# Patient Record
Sex: Male | Born: 1965 | Race: White | Hispanic: No | Marital: Married | Smoking: Former smoker
Health system: Southern US, Community
[De-identification: ages and names within clinical notes are randomized; demographics above are authoritative.]

## PROBLEM LIST (undated history)

## (undated) DIAGNOSIS — R7303 Prediabetes: Secondary | ICD-10-CM

## (undated) DIAGNOSIS — I4821 Permanent atrial fibrillation: Secondary | ICD-10-CM

## (undated) HISTORY — DX: Permanent atrial fibrillation: I48.21

## (undated) HISTORY — DX: Prediabetes: R73.03

---

## 2014-07-22 HISTORY — PX: SEPTOPLASTY: SUR1290

## 2014-08-20 DIAGNOSIS — J324 Chronic pansinusitis: Secondary | ICD-10-CM

## 2014-08-20 DIAGNOSIS — J3489 Other specified disorders of nose and nasal sinuses: Secondary | ICD-10-CM | POA: Insufficient documentation

## 2014-08-20 DIAGNOSIS — D140 Benign neoplasm of middle ear, nasal cavity and accessory sinuses: Secondary | ICD-10-CM

## 2014-08-20 HISTORY — DX: Other specified disorders of nose and nasal sinuses: J34.89

## 2014-08-20 HISTORY — DX: Benign neoplasm of middle ear, nasal cavity and accessory sinuses: D14.0

## 2014-08-20 HISTORY — DX: Chronic pansinusitis: J32.4

## 2014-12-27 ENCOUNTER — Inpatient Hospital Stay (HOSPITAL_COMMUNITY)
Admission: EM | Admit: 2014-12-27 | Discharge: 2014-12-30 | DRG: 175 | Disposition: A | Discharge status: Home / Self Care | Payer: BLUE CROSS/BLUE SHIELD | Source: Other Acute Inpatient Hospital | Attending: Internal Medicine | Admitting: Internal Medicine

## 2014-12-27 ENCOUNTER — Encounter (HOSPITAL_COMMUNITY): Payer: Self-pay | Admitting: *Deleted

## 2014-12-27 DIAGNOSIS — I10 Essential (primary) hypertension: Secondary | ICD-10-CM | POA: Diagnosis present

## 2014-12-27 DIAGNOSIS — Z833 Family history of diabetes mellitus: Secondary | ICD-10-CM

## 2014-12-27 DIAGNOSIS — I82493 Acute embolism and thrombosis of other specified deep vein of lower extremity, bilateral: Secondary | ICD-10-CM | POA: Diagnosis present

## 2014-12-27 DIAGNOSIS — J9601 Acute respiratory failure with hypoxia: Secondary | ICD-10-CM

## 2014-12-27 DIAGNOSIS — I2699 Other pulmonary embolism without acute cor pulmonale: Secondary | ICD-10-CM | POA: Diagnosis present

## 2014-12-27 DIAGNOSIS — Z6841 Body Mass Index (BMI) 40.0 and over, adult: Secondary | ICD-10-CM

## 2014-12-27 DIAGNOSIS — D696 Thrombocytopenia, unspecified: Secondary | ICD-10-CM | POA: Diagnosis present

## 2014-12-27 DIAGNOSIS — I482 Chronic atrial fibrillation: Secondary | ICD-10-CM

## 2014-12-27 DIAGNOSIS — Z8249 Family history of ischemic heart disease and other diseases of the circulatory system: Secondary | ICD-10-CM | POA: Diagnosis not present

## 2014-12-27 DIAGNOSIS — I4891 Unspecified atrial fibrillation: Secondary | ICD-10-CM | POA: Diagnosis present

## 2014-12-27 HISTORY — DX: Essential (primary) hypertension: I10

## 2014-12-27 HISTORY — DX: Other pulmonary embolism without acute cor pulmonale: I26.99

## 2014-12-27 HISTORY — DX: Acute respiratory failure with hypoxia: J96.01

## 2014-12-27 HISTORY — DX: Morbid (severe) obesity due to excess calories: E66.01

## 2014-12-27 LAB — MRSA PCR SCREENING: MRSA by PCR: POSITIVE — AB

## 2014-12-27 MED ORDER — LEVALBUTEROL HCL 0.63 MG/3ML IN NEBU: 0.6300 mg | INHALATION_SOLUTION | Freq: Four times a day (QID) | RESPIRATORY_TRACT | Status: DC | PRN

## 2014-12-27 MED ORDER — SODIUM CHLORIDE 0.9 % IJ SOLN
3.0000 mL | INTRAMUSCULAR | Status: DC | PRN
Start: 2014-12-27 — End: 2014-12-28

## 2014-12-27 MED ORDER — ONDANSETRON HCL 4 MG/2ML IJ SOLN: 4.0000 mg | Freq: Four times a day (QID) | INTRAMUSCULAR | Status: DC | PRN

## 2014-12-27 MED ORDER — METOPROLOL TARTRATE 25 MG PO TABS
25.0000 mg | ORAL_TABLET | Freq: Two times a day (BID) | ORAL | Status: DC
Start: 2014-12-27 — End: 2014-12-28
  Administered 2014-12-27: 25 mg via ORAL
  Filled 2014-12-27 (×3): qty 1

## 2014-12-27 MED ORDER — OXYCODONE HCL 5 MG PO TABS
5.0000 mg | ORAL_TABLET | ORAL | Status: DC | PRN
  Administered 2014-12-29 – 2014-12-30 (×4): 5 mg via ORAL
  Filled 2014-12-27 (×4): qty 1

## 2014-12-27 MED ORDER — MUPIROCIN 2 % EX OINT
1.0000 "application " | TOPICAL_OINTMENT | Freq: Two times a day (BID) | CUTANEOUS | Status: DC
  Administered 2014-12-27 – 2014-12-30 (×6): 1 via NASAL
  Filled 2014-12-27 (×2): qty 22

## 2014-12-27 MED ORDER — DILTIAZEM HCL 100 MG IV SOLR
5.0000 mg/h | INTRAVENOUS | Status: DC
  Administered 2014-12-27: 5 mg/h via INTRAVENOUS
  Filled 2014-12-27: qty 100

## 2014-12-27 MED ORDER — RIVAROXABAN 15 MG PO TABS
15.0000 mg | ORAL_TABLET | Freq: Two times a day (BID) | ORAL | Status: DC
  Administered 2014-12-27 – 2014-12-30 (×7): 15 mg via ORAL
  Filled 2014-12-27 (×10): qty 1

## 2014-12-27 MED ORDER — SODIUM CHLORIDE 0.9 % IJ SOLN
3.0000 mL | Freq: Two times a day (BID) | INTRAMUSCULAR | Status: DC
  Administered 2014-12-29 – 2014-12-30 (×3): 3 mL via INTRAVENOUS

## 2014-12-27 MED ORDER — ONDANSETRON HCL 4 MG PO TABS: 4.0000 mg | ORAL_TABLET | Freq: Four times a day (QID) | ORAL | Status: DC | PRN

## 2014-12-27 MED ORDER — SODIUM CHLORIDE 0.9 % IJ SOLN: 3.0000 mL | Freq: Two times a day (BID) | INTRAMUSCULAR | Status: DC

## 2014-12-27 MED ORDER — SODIUM CHLORIDE 0.9 % IV SOLN: 250.0000 mL | INTRAVENOUS | Status: DC | PRN

## 2014-12-27 MED ORDER — ACETAMINOPHEN 325 MG PO TABS
650.0000 mg | ORAL_TABLET | Freq: Four times a day (QID) | ORAL | Status: DC | PRN
  Administered 2014-12-28 – 2014-12-29 (×2): 650 mg via ORAL
  Filled 2014-12-27 (×2): qty 2

## 2014-12-27 MED ORDER — ACETAMINOPHEN 650 MG RE SUPP: 650.0000 mg | Freq: Four times a day (QID) | RECTAL | Status: DC | PRN

## 2014-12-27 MED ORDER — CHLORHEXIDINE GLUCONATE CLOTH 2 % EX PADS
6.0000 | MEDICATED_PAD | Freq: Every day | CUTANEOUS | Status: DC
  Administered 2014-12-28 – 2014-12-30 (×3): 6 via TOPICAL

## 2014-12-27 NOTE — H&P (Addendum)
History and Physical  [name] Berg ZOX:[id number] DOB: 1966-04-27 DOA: 12/27/2014  Referring physician: [facility] PCP: Irena Reichmann, DO   Chief Complaint: Shortness of breath  HPI: [name] Berg is a 49 y.o. male  Past history morbid obesity and hypertension who has been in good health with no major problems and had a recent CT surgery of his sinuses to remove polyps approximately 1-2 weeks ago. Prior to surgery, patient was due to found to have rate controlled atrial fibrillation, new diagnosis. Plan was for patient to start aspirin, but this was to wait until after his sinus surgery. Patient overall has been feeling well, but then in the last week had noticed increased shortness of breath and cough. He was started on Levaquin for presumed bronchitis.   When symptoms persisted he went to the [facility] emergency room and a CT scan of the chest noted multiple fair sized right-sided pulmonary emboli. There was a question of right ventricular heart strain. Patient himself required only minimal supplemental oxygen, but then went into rapid atrial fibrillation and required 50% Venturi nonrebreather mask. Patient was started on IV Cardizem and heart rate was able to be controlled. Patient is not had any pain during this hospitalization. Because of worsening status, it was recommended patient be transferred and he was transferred to the hospitalist service at [facility]. An echocardiogram done at [facility] noted no signs of left ventricular strain and only some mild right atrial enlargement and mild mitral/tricuspid regurg. Lab work done at [facility] including CBC, cmet and BNP all were normal, with the exception of a mild thrombocytopenia of 90. Patient given a dose of full strength Lovenox prior to transfer.   Review of Systems:   Patient seen after transfer to stepdown Patient has little complaints. Denies any headache, vision changes, dysphagia, chest pain, palpitations, wheeze, cough,  abdominal pain, hematuria, dysuria, constipation, diarrhea, focal extremity numbness or weakness or pain. With the oxygen mask on currently, patient denies any shortness of breath. His review of systems is otherwise negative  Past medical history: Hypertension, morbid obesity  Past surgical history: Recent sinus polyp removal surgery   Social History: Patient does not smoke, rarely drinks, no drug use   Patient lives at at home with his wife & is able to participate in activities of daily living with out assistance  Allergies not on file  Family history: Hypertension and diabetes  Prior to Admission medications   Not on File    Physical Exam: BP 139/93 mmHg  Pulse 94  Temp(Src) 97 F (36.1 C) (Axillary)  Resp 23  Ht  (1.854 m)  Wt 203.211 kg (448 lb)  BMI 59.12 kg/m2  SpO2 96%  General:  Alert and oriented 3, no acute distress Eyes: Sclera nonicteric, extra ocular movements are intact ENT: Normocephalic, atraumatic, mucous murmurs are dry, current Venturi mask on Neck: Thick Cardiovascular: Irregular rhythm, rate controlled Respiratory: Decreased breath sounds throughout secondary to body habitus Abdomen: Soft, obese, nontender, positive bowel sounds Skin: Dry skin Musculoskeletal: No clubbing or cyanosis, trace edema Psychiatric: Patient is appropriate, no evidence of psychoses Neurologic: No focal deficits           Labs on Admission:  None here, labs from [place] reviewed.  BNP, Bmet, CBC   Radiological Exams on Admission: No results found.  EKG: Independently reviewed. (Done at [facility]) rapid atrial fibrillation  Assessment/Plan Present on Admission:  . HTN (hypertension): Currently on losartan at home. Will hold this while we are trying  to treat him with beta blocker. See below.  . Morbid obesity: Pt means criteria with BMI greater than 40  . A-fib: Currently in Cardizem drip. Have started oral metoprolol and will work to weaning off IV Cardizem.  Long-term, patient's chads score is only 1, although he will require anticoagulation at least for the next 6 months   . acute respiratory failure acute respiratory failure secondary to rapid atrial fibrillation and PE (pulmonary embolism): Principal problem. Received 1 dose of Lovenox in the emergency room. We'll continue this plus Xarelto.  In review of [name] Berg labs, no evidence of renal or hepatic dysfunction. No review his history of bleeding. The count slightly low on admission at 95. Reviewed case with interventional radiology here who is able to review patient's echocardiogram and CT of chest. Given the patient's stability, absence of abnormality on echocardiogram, thrombolytic therapy not indicated. Given that he is not been started on oral anticoagulant yet, filter not indicated.  Unclear etiology for embolus, we'll check lower extremity Doppler.  Patient does not report any recent long trips or injuries. Both he and his wife state that after his recent sinus surgery, he was up and mobile quite a bit. Cannot get any hypercoagulable workup while patient has acute clot and is on anticoagulants, so if this workup is to be done, needs to be done in 6 months once patient is off xarelto.  Consultants: Case d/w Int Radiology  Code Status: full code  Family Communication: Spoke with wife by phone  Disposition Plan: Cont in stepdown until off Cardizem & less supplemental O2 requirements.  Time spent: 35 minutes  [name] Berg,[name] Berg [facility] Pager [phone]   For [name] Berg

## 2014-12-27 NOTE — Progress Notes (Signed)
ANTICOAGULATION CONSULT NOTE - Initial Consult  Pharmacy Consult for Xarelto Indication: pulmonary embolus  Allergies not on file  Patient Measurements: Height: 6\' 1"  (185.4 cm) Weight: (!) 448 lb (203.211 kg) IBW/kg (Calculated) : 79.9  Vital Signs: Temp: 97 F (36.1 C) (01/09 1432) Temp Source: Axillary (01/09 1432) BP: 139/93 mmHg (01/09 1432) Pulse Rate: 94 (01/09 1432)  Labs: No results for input(s): HGB, HCT, PLT, APTT, LABPROT, INR, HEPARINUNFRC, CREATININE, CKTOTAL, CKMB, TROPONINI in the last 72 hours.  CrCl cannot be calculated (Patient has no serum creatinine result on file.).   Medical History: No past medical history on file.  Assessment: 49 year old male with atrial fibrillation and PE who [facility] after 1 dose of Lovenox and now to start Xarelto for anticoagulation.   [facility] 200[facility]  SQ at 9:56 AM today.  No other anticoagulants given at [facility].  Called and confirmed this with the RN in the ED at [facility].   [facility]: 14.4/45, Plts 309 SCr from [facility]: 0.9  Goal of Therapy:  Monitor platelets by anticoagulation protocol: Yes   Plan:  Start Xarelto tonight at 2200 (12 hours after last Lovenox dose).  Xarelto 15mg  po BID with food for 21 days then 20mg  po daily.   Link SnufferJessica Aluel Schwarz, PharmD, BCPS Clinical Pharmacist [phone] 12/27/2014,4:03 PM

## 2014-12-28 DIAGNOSIS — I2699 Other pulmonary embolism without acute cor pulmonale: Secondary | ICD-10-CM

## 2014-12-28 LAB — COMPREHENSIVE METABOLIC PANEL
ALT: 29 U/L (ref 0–53)
ANION GAP: 9 (ref 5–15)
AST: 35 U/L (ref 0–37)
Albumin: 3.2 g/dL — ABNORMAL LOW (ref 3.5–5.2)
Alkaline Phosphatase: 64 U/L (ref 39–117)
BUN: 11 mg/dL (ref 6–23)
CHLORIDE: 103 meq/L (ref 96–112)
CO2: 29 mmol/L (ref 19–32)
Calcium: 9 mg/dL (ref 8.4–10.5)
Creatinine, Ser: 0.82 mg/dL (ref 0.50–1.35)
GFR calc Af Amer: >90 mL/min (ref >90)
GFR calc non Af Amer: >90 mL/min (ref >90)
Glucose, Bld: 102 mg/dL — ABNORMAL HIGH (ref 70–99)
POTASSIUM: 4.6 mmol/L (ref 3.5–5.1)
SODIUM: 141 mmol/L (ref 135–145)
Total Bilirubin: 0.4 mg/dL (ref 0.3–1.2)
Total Protein: 7.1 g/dL (ref 6.0–8.3)

## 2014-12-28 LAB — CBC
HEMATOCRIT: 44.3 % (ref 39.0–52.0)
Hemoglobin: 14.3 g/dL (ref 13.0–17.0)
MCH: 32.9 pg (ref 26.0–34.0)
MCHC: 32.3 g/dL (ref 30.0–36.0)
MCV: 102.1 fL — AB (ref 78.0–100.0)
Platelets: ADEQUATE 10*3/uL (ref 150–400)
RBC: 4.34 MIL/uL (ref 4.22–5.81)
RDW: 14.1 % (ref 11.5–15.5)
WBC: 7.4 10*3/uL (ref 4.0–10.5)

## 2014-12-28 LAB — TROPONIN I

## 2014-12-28 MED ORDER — FUROSEMIDE 10 MG/ML IJ SOLN
20.0000 mg | Freq: Once | INTRAMUSCULAR | Status: AC
  Administered 2014-12-28: 20 mg via INTRAVENOUS

## 2014-12-28 MED ORDER — METOPROLOL TARTRATE 50 MG PO TABS
50.0000 mg | ORAL_TABLET | Freq: Two times a day (BID) | ORAL | Status: DC
  Administered 2014-12-28 (×2): 50 mg via ORAL
  Filled 2014-12-28 (×5): qty 1

## 2014-12-28 MED ORDER — FUROSEMIDE 20 MG PO TABS
20.0000 mg | ORAL_TABLET | Freq: Two times a day (BID) | ORAL | Status: DC
  Administered 2014-12-28 – 2014-12-30 (×5): 20 mg via ORAL
  Filled 2014-12-28 (×6): qty 1

## 2014-12-28 NOTE — Progress Notes (Addendum)
*  Preliminary Results* Bilateral lower extremity venous duplex completed. Bilateral lower extremities are positive for deep vein thrombosis involving the right posterior tibial, right peroneal, left femoral, and left peroneal veins. There is no evidence of Baker's cyst bilaterally.  12/28/2014  Gertie FeyMichelle Miguelina Fore, RVT, RDCS, RDMS

## 2014-12-28 NOTE — Progress Notes (Signed)
PROGRESS NOTE  [name] Berg ZOX:[id number] DOB: 1966-01-23 DOA: 12/27/2014 PCP: Irena ReichmannOLLINS, DANA, DO  HPI/Recap of past 24 hours: Patient is a 49 year old male with past medical history of recently diagnosed atrial fibrillation plus morbid obesity and hypertension who had one week of cough, dyspnea and came [facility] on 1/8 and found to have pulmonary embolus on right side. Echocardiogram noted no evidence of cardiovascular compromise. Patient went into rapid age or fibrillation requiring IV Cardizem, but also requiring been supplemental oxygen by facemask. Patient [facility] on 1/9.  Started on Xarelto.  Today, patient feeling a little bit better. Comfortable no complaints. Still requiring supplemental oxygen and heart rate in 70s to 80s  Assessment/Plan: Principal Problem:   Pulmonary embolus: Fully anticoagulated, now on xarelto. Active Problems:   HTN (hypertension): Blood pressure stable on by mouth metoprolol and Cardizem. Holding losartan   Morbid obesity: Patient meets criteria with BMI greater than 40   A-fib: On IV Cardizem and started metoprolol yesterday. We'll increase dose of metoprolol so we can wean him off of drip    Acute respiratory failure with hypoxia: Still requiring significant oxygen. No evidence of heart failure by echocardiogram. Will give 1 dose of Lasix and hopefully with this plus weaning off of Cardizem drip, can get oxygen down to nasal cannula   Code Status: Full code  Family Communication: Spoke with wife by phone  Disposition Plan: Once off of Cardizem drip, transfer to floor   Consultants:  None  Procedures:  [facility] noting no evidence of right ventricular strain, mild tricuspid and mitral regurg. Preserved ejection fraction. No evidence of diastolic dysfunction  Antibiotics:  None   Objective: BP 131/80 mmHg  Pulse 97  Temp(Src) 98.6 F (37 C) (Axillary)   Resp 15  Ht 6\' 1"  (1.854 m)  Wt 203.211 kg (448 lb)  BMI 59.12 kg/m2  SpO2 88%  Intake/Output Summary (Last 24 hours) at 12/28/14 1032 Last data filed at 12/28/14 0908  Gross per 24 hour  Intake      0 ml  Output   1050 ml  Net  -1050 ml   Filed Weights   12/27/14 1432  Weight: 203.211 kg (448 lb)    Exam:   General:  Alert and oriented 3, no acute distress  Cardiovascular: Irregular rhythm, rate controlled  Respiratory: Decreased breath sounds throughout secondary to body habitus  Abdomen: Soft, obese, nontender, positive bowel sounds  Musculoskeletal: no clubbing or cyanosis, trace pitting edema   Data Reviewed: Basic Metabolic Panel:  Recent Labs Lab 12/28/14 0400  NA 141  K 4.6  CL 103  CO2 29  GLUCOSE 102*  BUN 11  CREATININE 0.82  CALCIUM 9.0   Liver Function Tests:  Recent Labs Lab 12/28/14 0400  AST 35  ALT 29  ALKPHOS 64  BILITOT 0.4  PROT 7.1  ALBUMIN 3.2*   No results for input(s): LIPASE, AMYLASE in the last 168 hours. No results for input(s): AMMONIA in the last 168 hours. CBC:  Recent Labs Lab 12/28/14 0400  WBC 7.4  HGB 14.3  HCT 44.3  MCV 102.1*  PLT PLATELET CLUMPS NOTED ON SMEAR, COUNT APPEARS ADEQUATE   Cardiac Enzymes:    Recent Labs Lab 12/28/14 0400  TROPONINI <0.03   BNP (last 3 results) No results for input(s): PROBNP in the last 8760 hours. CBG: No results for input(s): GLUCAP in the last 168 hours.  Recent Results (from the past  240 hour(s))  MRSA PCR Screening     Status: Abnormal   Collection Time: 12/27/14  3:10 PM  Result Value Ref Range Status   MRSA by PCR POSITIVE (A) NEGATIVE Final    Comment:        The GeneXpert MRSA Assay (FDA approved for NASAL specimens only), is one component of a comprehensive MRSA colonization surveillance program. It is not intended to diagnose MRSA infection nor to guide or monitor treatment for MRSA infections. RESULT CALLED TO, READ BACK BY AND VERIFIED  WITH: GRACOU,R RN 12/27/14 1923 WOOTEN,K      Studies: No results found.  Scheduled Meds: . Chlorhexidine Gluconate Cloth  6 each Topical Q0600  . metoprolol tartrate  50 mg Oral BID  . mupirocin ointment  1 application Nasal BID  . Rivaroxaban  15 mg Oral BID WC  . sodium chloride  3 mL Intravenous Q12H    Continuous Infusions: . diltiazem (CARDIZEM) infusion 5 mg/hr (12/27/14 2224)     Time spent: 25 minutes  Hollice Espy  [facility] Pager [phone]. If 7PM-7AM, please contact night-coverage at [email or web address], password [facility] 12/28/2014, 10:32 AM  LOS: 1 day

## 2014-12-28 NOTE — Progress Notes (Signed)
Follow up note   Patient admitted for shortness of breath, found to have right sided pulmonary emboli, now on Xarelto. I was notified by vascular that dopplers revealed bilateral lower extremity DVT's. Continue anticoagulation.

## 2014-12-29 LAB — BASIC METABOLIC PANEL
Anion gap: 3 — ABNORMAL LOW (ref 5–15)
BUN: 15 mg/dL (ref 6–23)
CALCIUM: 8.4 mg/dL (ref 8.4–10.5)
CO2: 32 mmol/L (ref 19–32)
CREATININE: 0.88 mg/dL (ref 0.50–1.35)
Chloride: 101 mEq/L (ref 96–112)
GFR calc non Af Amer: >90 mL/min (ref >90)
GLUCOSE: 91 mg/dL (ref 70–99)
Potassium: 5 mmol/L (ref 3.5–5.1)
Sodium: 136 mmol/L (ref 135–145)

## 2014-12-29 LAB — BRAIN NATRIURETIC PEPTIDE: B Natriuretic Peptide: 39.9 pg/mL (ref 0.0–100.0)

## 2014-12-29 MED ORDER — LEVALBUTEROL HCL 0.63 MG/3ML IN NEBU
0.6300 mg | INHALATION_SOLUTION | Freq: Three times a day (TID) | RESPIRATORY_TRACT | Status: DC
Start: 2014-12-29 — End: 2014-12-30
  Administered 2014-12-29 – 2014-12-30 (×4): 0.63 mg via RESPIRATORY_TRACT
  Filled 2014-12-29 (×7): qty 3

## 2014-12-29 MED ORDER — LEVALBUTEROL HCL 0.63 MG/3ML IN NEBU
0.6300 mg | INHALATION_SOLUTION | Freq: Three times a day (TID) | RESPIRATORY_TRACT | Status: DC
  Administered 2014-12-29: 0.63 mg via RESPIRATORY_TRACT
  Filled 2014-12-29: qty 3

## 2014-12-29 MED ORDER — METOPROLOL TARTRATE 50 MG PO TABS
75.0000 mg | ORAL_TABLET | Freq: Two times a day (BID) | ORAL | Status: DC
  Administered 2014-12-29 – 2014-12-30 (×3): 75 mg via ORAL
  Filled 2014-12-29 (×4): qty 1

## 2014-12-29 NOTE — Progress Notes (Signed)
Utilization review completed. Neziah Braley, RN, BSN. 

## 2014-12-29 NOTE — Progress Notes (Signed)
PROGRESS NOTE  [name] RougeJohn Berg WGN:[id number] DOB: [birth year] DOA: 12/27/2014 PCP: Irena ReichmannOLLINS, DANA, DO  HPI/Recap of past 24 hours: Patient is a [age] year old male with past medical history of recently diagnosed atrial fibrillation plus morbid obesity and hypertension who had one week of cough, dyspnea and came [facility] on 1/8 and found to have pulmonary embolus on right side. Echocardiogram noted no evidence of cardiovascular compromise. Patient went into rapid age or fibrillation requiring IV Cardizem, but also requiring been supplemental oxygen by facemask. [facility] 1/9.  Started on Xarelto.  Lower ext doppler confirmed bilat DVT.  Able to be weaned off of Cardizem.  However, attempts to wean down O2 difficult & patient still on non-breather.  Patient today doing okay. Feels comfortable. Does not feel acutely short of breath  Assessment/Plan: Principal Problem:   Pulmonary embolus: Fully anticoagulated, now on xarelto. Active Problems:   HTN (hypertension): Blood pressure stable on by mouth metoprolol and Cardizem. Holding losartan   Morbid obesity: Patient meets criteria with BMI greater than 40   A-fib: Rate controlled and off of IV Cardizem. On by mouth metoprolol    Acute respiratory failure with hypoxia: Still requiring significant oxygen. No evidence of heart failure by echocardiogram. Have started in spirometer, scheduled nebulizers and continue Lasix. Patient has diuresed 2.7 L   Code Status: Full code  Family Communication: Updated wife by phone  Disposition Plan: Transfer to floor once able to wean down to nasal cannula   Consultants:  None  Procedures:  [facility] noting no evidence of right ventricular strain, mild tricuspid and mitral regurg. Preserved ejection fraction. No evidence of diastolic dysfunction  Antibiotics:  None   Objective: BP 131/85 mmHg  Pulse 94   Temp(Src) 98.5 F (36.9 C) (Oral)  Resp 12  Ht 6\' 1"  (1.854 m)  Wt 203.211 kg (448 lb)  BMI 59.12 kg/m2  SpO2 91%  Intake/Output Summary (Last 24 hours) at 12/29/14 1249 Last data filed at 12/29/14 0923  Gross per 24 hour  Intake    730 ml  Output   1450 ml  Net   -720 ml   Filed Weights   12/27/14 1432  Weight: 203.211 kg (448 lb)    Exam: No change  General:  Alert and oriented 3, no acute distress  Cardiovascular: Irregular rhythm, rate controlled  Respiratory: Decreased breath sounds throughout secondary to body habitus  Abdomen: Soft, obese, nontender, positive bowel sounds  Musculoskeletal: no clubbing or cyanosis, trace pitting edema   Data Reviewed: Basic Metabolic Panel:  Recent Labs Lab 12/28/14 0400 12/29/14 0312  NA 141 136  K 4.6 5.0  CL 103 101  CO2 29 32  GLUCOSE 102* 91  BUN 11 15  CREATININE 0.82 0.88  CALCIUM 9.0 8.4   Liver Function Tests:  Recent Labs Lab 12/28/14 0400  AST 35  ALT 29  ALKPHOS 64  BILITOT 0.4  PROT 7.1  ALBUMIN 3.2*   No results for input(s): LIPASE, AMYLASE in the last 168 hours. No results for input(s): AMMONIA in the last 168 hours. CBC:  Recent Labs Lab 12/28/14 0400  WBC 7.4  HGB 14.3  HCT 44.3  MCV 102.1*  PLT PLATELET CLUMPS NOTED ON SMEAR, COUNT APPEARS ADEQUATE   Cardiac Enzymes:    Recent Labs Lab 12/28/14 0400  TROPONINI <0.03   BNP (last 3 results) No results for input(s): PROBNP in the last 8760 hours. CBG: No results  for input(s): GLUCAP in the last 168 hours.  Recent Results (from the past 240 hour(s))  MRSA PCR Screening     Status: Abnormal   Collection Time: 12/27/14  3:10 PM  Result Value Ref Range Status   MRSA by PCR POSITIVE (A) NEGATIVE Final    Comment:        The GeneXpert MRSA Assay (FDA approved for NASAL specimens only), is one component of a comprehensive MRSA colonization surveillance program. It is not intended to diagnose MRSA infection nor to guide  or monitor treatment for MRSA infections. RESULT CALLED TO, READ BACK BY AND VERIFIED WITH: GRACOU,R RN 12/27/14 1923 WOOTEN,K      Studies: No results found.  Scheduled Meds: . Chlorhexidine Gluconate Cloth  6 each Topical Q0600  . furosemide  20 mg Oral BID  . levalbuterol  0.63 mg Nebulization Q8H  . metoprolol tartrate  75 mg Oral BID  . mupirocin ointment  1 application Nasal BID  . Rivaroxaban  15 mg Oral BID WC  . sodium chloride  3 mL Intravenous Q12H    Continuous Infusions:     Time spent: 15 minutes  Hollice Espy  [facility] Pager [phone]. If 7PM-7AM, please contact night-coverage at [email or web address], password [facility] 12/29/2014, 12:49 PM  LOS: 2 days

## 2014-12-30 MED ORDER — METOPROLOL TARTRATE 100 MG PO TABS: 100.0000 mg | ORAL_TABLET | Freq: Two times a day (BID) | ORAL | Status: AC

## 2014-12-30 MED ORDER — RIVAROXABAN (XARELTO) EDUCATION KIT FOR DVT/PE PATIENTS
PACK | Freq: Once | Status: AC
  Administered 2014-12-30: 18:00:00
  Filled 2014-12-30: qty 1

## 2014-12-30 MED ORDER — RIVAROXABAN (XARELTO) VTE STARTER PACK (15 & 20 MG): ORAL_TABLET | ORAL | Status: DC

## 2014-12-30 MED ORDER — RIVAROXABAN 20 MG PO TABS: ORAL_TABLET | ORAL | Status: DC

## 2014-12-30 NOTE — Discharge Instructions (Addendum)
Pulmonary Embolism A pulmonary (lung) embolism (PE) is a blood clot that has traveled to the lung and results in a blockage of blood flow in the affected lung. Most clots come from deep veins in the legs or pelvis. PE is a dangerous and potentially life-threatening condition that can be treated if identified. CAUSES Blood clots form in a vein for different reasons. Usually several things cause blood clots. They include:  The flow of blood slows down.  The inside of the vein is damaged in some way.  The person has a condition that makes the blood clot more easily. RISK FACTORS Some people are more likely than others to develop PE. Risk factors include:   Smoking.  Being overweight (obese).  Sitting or lying still for a long time. This includes long-distance travel, paralysis, or recovery from an illness or surgery. Other factors that increase risk are:   Older age, especially over 72 years of age.  Having a family history of blood clots or if you have already had a blood clot.  Having major or lengthy surgery. This is especially true for surgery on the hip, knee, or belly (abdomen). Hip surgery is particularly high risk.  Having a long, thin tube (catheter) placed inside a vein during a medical procedure.  Breaking a hip or leg.  Having cancer or cancer treatment.  Medicines containing the male hormone estrogen. This includes birth control pills and hormone replacement therapy.  Other circulation or heart problems.  Pregnancy and childbirth.  Hormone changes make the blood clot more easily during pregnancy.  The fetus puts pressure on the veins of the pelvis.  There is a risk of injury to veins during delivery or a caesarean delivery. The risk is highest just after childbirth.  PREVENTION   Exercise the legs regularly. Take a brisk 30 minute walk every day.  Maintain a weight that is appropriate for your height.  Avoid sitting or lying in bed for long periods of  time without moving your legs.  Women, particularly those over the age of 28 years, should consider the risks and benefits of taking estrogen medicines, including birth control pills.  Do not smoke, especially if you take estrogen medicines.  Long-distance travel can increase your risk. You should exercise your legs by walking or pumping the muscles every hour.  Many of the risk factors above relate to situations that exist with hospitalization, either for illness, injury, or elective surgery. Prevention may include medical and nonmedical measures.   Your health care provider will assess you for the need for venous thromboembolism prevention when you are admitted to the hospital. If you are having surgery, your surgeon will assess you the day of or day after surgery.  SYMPTOMS  The symptoms of a PE usually start suddenly and include:  Shortness of breath.  Coughing.  Coughing up blood or blood-tinged mucus.  Chest pain. Pain is often worse with deep breaths.  Rapid heartbeat. DIAGNOSIS  If a PE is suspected, your health care provider will take a medical history and perform a physical exam. Other tests that may be required include:  Blood tests, such as studies of the clotting properties of your blood.  Imaging tests, such as ultrasound, CT, MRI, and other tests to see if you have clots in your legs or lungs.  An electrocardiogram. This can look for heart strain from blood clots in the lungs. TREATMENT   The most common treatment for a PE is blood thinning (anticoagulant) medicine, which reduces  the blood's tendency to clot. Anticoagulants can stop new blood clots from forming and old clots from growing. They cannot dissolve existing clots. Your body does this by itself over time. Anticoagulants can be given by mouth, through an intravenous (IV) tube, or by injection. Your health care provider will determine the best program for you.  Less commonly, clot-dissolving medicines  (thrombolytics) are used to dissolve a PE. They carry a high risk of bleeding, so they are used mainly in severe cases.  Very rarely, a blood clot in the leg needs to be removed surgically.  If you are unable to take anticoagulants, your health care provider may arrange for you to have a filter placed in a main vein in your abdomen. This filter prevents clots from traveling to your lungs. HOME CARE INSTRUCTIONS   Take all medicines as directed by your health care provider.  Learn as much as you can about DVT.  Wear a medical alert bracelet or carry a medical alert card.  Ask your health care provider how soon you can go back to normal activities. It is important to stay active to prevent blood clots. If you are on anticoagulant medicine, avoid contact sports.  It is very important to exercise. This is especially important while traveling, sitting, or standing for long periods of time. Exercise your legs by walking or by tightening and relaxing your leg muscles regularly. Take frequent walks.  You may need to wear compression stockings. These are tight elastic stockings that apply pressure to the lower legs. This pressure can help keep the blood in the legs from clotting. Taking Warfarin Warfarin is a daily medicine that is taken by mouth. Your health care provider will advise you on the length of treatment (usually 3-6 months, sometimes lifelong). If you take warfarin:  Understand how to take warfarin and foods that can affect how warfarin works in Veterinary surgeon.  Too much and too little warfarin are both dangerous. Too much warfarin increases the risk of bleeding. Too little warfarin continues to allow the risk for blood clots. Warfarin and Regular Blood Testing While taking warfarin, you will need to have regular blood tests to measure your blood clotting time. These blood tests usually include both the prothrombin time (PT) and international normalized ratio (INR) tests. The PT and INR  results allow your health care provider to adjust your dose of warfarin. It is very important that you have your PT and INR tested as often as directed by your health care provider.  Warfarin and Your Diet Avoid major changes in your diet, or notify your health care provider before changing your diet. Arrange a visit with a registered dietitian to answer your questions. Many foods, especially foods high in vitamin K, can interfere with warfarin and affect the PT and INR results. You should eat a consistent amount of foods high in vitamin K. Foods high in vitamin K include:   Spinach, kale, broccoli, cabbage, collard and turnip greens, Brussels sprouts, peas, cauliflower, seaweed, and parsley.  Beef and pork liver.  Green tea.  Soybean oil. Warfarin with Other Medicines Many medicines can interfere with warfarin and affect the PT and INR results. You must:  Tell your health care provider about any and all medicines, vitamins, and supplements you take, including aspirin and other over-the-counter anti-inflammatory medicines. Be especially cautious with aspirin and anti-inflammatory medicines. Ask your health care provider before taking these.  Do not take or discontinue any prescribed or over-the-counter medicine except on the advice  of your health care provider or pharmacist. Warfarin Side Effects Warfarin can have side effects, such as easy bruising and difficulty stopping bleeding. Ask your health care provider or pharmacist about other side effects of warfarin. You will need to:  Hold pressure over cuts for longer than usual.  Notify your dentist and other health care providers that you are taking warfarin before you undergo any procedures where bleeding may occur. Warfarin with Alcohol and Tobacco   Drinking alcohol frequently can increase the effect of warfarin, leading to excess bleeding. It is best to avoid alcoholic drinks or consume only very small amounts while taking warfarin.  Notify your health care provider if you change your alcohol intake.  Do not use any tobacco products including cigarettes, chewing tobacco, or electronic cigarettes. If you smoke, quit. Ask your health care provider for help with quitting smoking. Alternative Medicines to Warfarin: Factor Xa Inhibitor Medicines  These blood thinning medicines are taken by mouth, usually for several weeks or longer. It is important to take the medicine every single day, at the same time each day.  There are no regular blood tests required when using these medicines.  There are fewer food and drug interactions than with warfarin.  The side effects of this class of medicine is similar to that of warfarin, including excessive bruising or bleeding. Ask your health care provider or pharmacist about other potential side effects. SEEK MEDICAL CARE IF:   You notice a rapid heartbeat.  You feel weaker or more tired than usual.  You feel faint.  You notice increased bruising.  Your symptoms are not getting better in the time expected.  You are having side effects of medicine. SEEK IMMEDIATE MEDICAL CARE IF:   You have chest pain.  You have trouble breathing.  You have new or increased swelling or pain in one leg.  You cough up blood.  You notice blood in vomit, in a bowel movement, or in urine.  You have a fever. Symptoms of PE may represent a serious problem that is an emergency. Do not wait to see if the symptoms will go away. Get medical help right away. Call your local emergency services (911 in the [place] Do not drive yourself to the hospital. Document Released: 12/02/2000 Document Revised: 04/21/2014 Document Reviewed: 12/16/2013 [facility] Patient Information 2015 [employer], [place]. This information is not intended to replace advice given to you by your health care provider. Make sure you discuss any questions you have with your health care provider. Rivaroxaban oral tablets What is this  medicine? RIVAROXABAN (ri va ROX a ban) is an anticoagulant (blood thinner). It is used to treat blood clots in the lungs or in the veins. It is also used after knee or hip surgeries to prevent blood clots. It is also used to lower the chance of stroke in people with a medical condition called atrial fibrillation. This medicine may be used for other purposes; ask your health care provider or pharmacist if you have questions. COMMON BRAND NAME(S): Xarelto, Xarelto Starter Pack What should I tell my health care provider before I take this medicine? They need to know if you have any of these conditions: -bleeding disorders -bleeding in the brain -blood in your stools (black or tarry stools) or if you have blood in your vomit -history of stomach bleeding -kidney disease -liver disease -low blood counts, like low white cell, platelet, or red cell counts -recent or planned spinal or epidural procedure -take medicines that treat or prevent  blood clots -an unusual or allergic reaction to rivaroxaban, other medicines, foods, dyes, or preservatives -pregnant or trying to get pregnant -breast-feeding How should I use this medicine? Take this medicine by mouth with a glass of water. Follow the directions on the prescription label. Take your medicine at regular intervals. Do not take it more often than directed. Do not stop taking except on your doctor's advice. Stopping this medicine may increase your risk of a blot clot. Be sure to refill your prescription before you run out of medicine. If you are taking this medicine after hip or knee replacement surgery, take it with or without food. If you are taking this medicine for atrial fibrillation, take it with your evening meal. If you are taking this medicine to treat blood clots, take it with food at the same time each day. If you are unable to swallow your tablet, you may crush the tablet and mix it in applesauce. Then, immediately eat the applesauce. You  should eat more food right after you eat the applesauce containing the crushed tablet. Talk to your pediatrician regarding the use of this medicine in children. Special care may be needed. Overdosage: If you think you have taken too much of this medicine contact a poison control center or emergency room at once. NOTE: This medicine is only for you. Do not share this medicine with others. What if I miss a dose? If you take your medicine once a day and miss a dose, take the missed dose as soon as you remember. If you take your medicine twice a day and miss a dose, take the missed dose immediately. In this instance, 2 tablets may be taken at the same time. The next day you should take 1 tablet twice a day as directed. What may interact with this medicine? -aspirin and aspirin-like medicines -certain antibiotics like erythromycin, azithromycin, and clarithromycin -certain medicines for fungal infections like ketoconazole and itraconazole -certain medicines for irregular heart beat like amiodarone, quinidine, dronedarone -certain medicines for seizures like carbamazepine, phenytoin -certain medicines that treat or prevent blood clots like warfarin, enoxaparin, and dalteparin -conivaptan -diltiazem -felodipine -indinavir -lopinavir; ritonavir -NSAIDS, medicines for pain and inflammation, like ibuprofen or naproxen -ranolazine -rifampin -ritonavir -St. Zeb's wort -verapamil This list may not describe all possible interactions. Give your health care provider a list of all the medicines, herbs, non-prescription drugs, or dietary supplements you use. Also tell them if you smoke, drink alcohol, or use illegal drugs. Some items may interact with your medicine. What should I watch for while using this medicine? Visit your doctor or health care professional for regular checks on your progress. Your condition will be monitored carefully while you are receiving this medicine. Notify your doctor or health  care professional and seek emergency treatment if you develop breathing problems; changes in vision; chest pain; severe, sudden headache; pain, swelling, warmth in the leg; trouble speaking; sudden numbness or weakness of the face, arm, or leg. These can be signs that your condition has gotten worse. If you are going to have surgery, tell your doctor or health care professional that you are taking this medicine. Tell your health care professional that you use this medicine before you have a spinal or epidural procedure. Sometimes people who take this medicine have bleeding problems around the spine when they have a spinal or epidural procedure. This bleeding is very rare. If you have a spinal or epidural procedure while on this medicine, call your health care professional immediately if you  have back pain, numbness or tingling (especially in your legs and feet), muscle weakness, paralysis, or loss of bladder or bowel control. Avoid sports and activities that might cause injury while you are using this medicine. Severe falls or injuries can cause unseen bleeding. Be careful when using sharp tools or knives. Consider using an Neurosurgeon. Take special care brushing or flossing your teeth. Report any injuries, bruising, or red spots on the skin to your doctor or health care professional. What side effects may I notice from receiving this medicine? Side effects that you should report to your doctor or health care professional as soon as possible: -allergic reactions like skin rash, itching or hives, swelling of the face, lips, or tongue -back pain -redness, blistering, peeling or loosening of the skin, including inside the mouth -signs and symptoms of bleeding such as bloody or black, tarry stools; red or dark-brown urine; spitting up blood or brown material that looks like coffee grounds; red spots on the skin; unusual bruising or bleeding from the eye, gums, or nose Side effects that usually do not  require medical attention (Report these to your doctor or health care professional if they continue or are bothersome.): -dizziness -muscle pain This list may not describe all possible side effects. Call your doctor for medical advice about side effects. You may report side effects to FDA at 1-800-FDA-1088. Where should I keep my medicine? Keep out of the reach of children. Store at room temperature between 15 and 30 degrees C (59 and 86 degrees F). Throw away any unused medicine after the expiration date. NOTE: This sheet is a summary. It may not cover all possible information. If you have questions about this medicine, talk to your doctor, pharmacist, or health care provider.  2015, [employer]vier/Gold Standard. (2014-03-27 18:47:48)  =====================================================================================================================  Information on my medicine - XARELTO (rivaroxaban)  This medication education was reviewed with me or my healthcare representative as part of my discharge preparation.  The pharmacist that spoke with me during my hospital stay was:  Wilhemina Bonito, [facility]  WHY WAS Carlena Hurl PRESCRIBED FOR YOU? Xarelto was prescribed to treat blood clots that may have been found in the veins of your legs (deep vein thrombosis) or in your lungs (pulmonary embolism) and to reduce the risk of them occurring again.  What do you need to know about Xarelto? The starting dose is one 15 mg tablet taken TWICE daily with food for the FIRST 21 DAYS then on (enter date)  01/18/2015  the dose is changed to one 20 mg tablet taken ONCE A DAY with your evening meal.  DO NOT stop taking Xarelto without talking to the health care provider who prescribed the medication.  Refill your prescription for 20 mg tablets before you run out.  After discharge, you should have regular check-up appointments with your healthcare provider that is prescribing your Xarelto.  In the future your dose  may need to be changed if your kidney function changes by a significant amount.  What do you do if you miss a dose? If you are taking Xarelto TWICE DAILY and you miss a dose, take it as soon as you remember. You may take two 15 mg tablets (total 30 mg) at the same time then resume your regularly scheduled 15 mg twice daily the next day.  If you are taking Xarelto ONCE DAILY and you miss a dose, take it as soon as you remember on the same day then continue your regularly scheduled once daily  regimen the next day. Do not take two doses of Xarelto at the same time.   Important Safety Information Xarelto is a blood thinner medicine that can cause bleeding. You should call your healthcare provider right away if you experience any of the following: ? Bleeding from an injury or your nose that does not stop. ? Unusual colored urine (red or dark brown) or unusual colored stools (red or black). ? Unusual bruising for unknown reasons. ? A serious fall or if you hit your head (even if there is no bleeding).  Some medicines may interact with Xarelto and might increase your risk of bleeding while on Xarelto. To help avoid this, consult your healthcare provider or pharmacist prior to using any new prescription or non-prescription medications, including herbals, vitamins, non-steroidal anti-inflammatory drugs (NSAIDs) and supplements.  This website has more information on Xarelto: [email or web address]

## 2014-12-30 NOTE — Progress Notes (Signed)
Pt was sating in the 90's-95 on 3L oxygen nasal cannula; pt wean down to 2L and sating at 92% nasal cannula; will continue to monitor quietly. Arabella MerlesP. Amo Evelise Reine RN.

## 2014-12-30 NOTE — Progress Notes (Signed)
Pt A&O x4; pt discharge education and instructions completed with pt at bedside; pt voices understanding and denies any questions. Pt IV and telemetry removed; pt discharge home with wife to transport him home. Pt educated on Xarelto; pharmacist came in to given him his starter discharge kit; pt handed his prescription for xarelto; pt to pick up other prescribed medication from preferred pharmacy on file; pt home O2 tank and equipment delivered to pt bedside and pt to informed to call [facility] when he gets home for his home oxygen delivery. Pt transported off unit via wheelchair with wife and belongings to the side. Francis Gaines Clanton Emanuelson RN.

## 2014-12-30 NOTE — Progress Notes (Signed)
Pt oxygen remained between 90 and 91 on room air whiles laying in bed but oxygen dropped to 85 and 80's on room air with ambulation and pt ambulated about 2050ft from room to hallway. Will continue to monitor pt quietly. Arabella MerlesP. Amo Jacqulyne Gladue RN.

## 2014-12-30 NOTE — Care Management Note (Signed)
    Page 1 of 1   12/30/2014     4:24:53 PM CARE MANAGEMENT NOTE 12/30/2014  Patient:  [name] Berg,[name]   Account Number:  [id number]  Date Initiated:  12/30/2014  Documentation initiated by:  Donn PieriniWEBSTER,Malcolm Hetz  Subjective/Objective Assessment:   Pt admitted with PE     Action/Plan:   PTA pt lived at home   Anticipated DC Date:  12/30/2014   Anticipated DC Plan:  HOME/SELF CARE      DC Planning Services  CM consult      PAC Choice  DURABLE MEDICAL EQUIPMENT   Choice offered to / List presented to:  C-1 Patient   DME arranged  [facility]      DME agency  [facility]        Status of service:  Completed, signed off Medicare Important Message given?   (If response is "NO", the following Medicare IM given date fields will be blank) Date Medicare IM given:   Medicare IM given by:   Date Additional Medicare IM given:   Additional Medicare IM given by:    Discharge Disposition:  HOME/SELF CARE  Per UR Regulation:  Reviewed for med. necessity/level of care/duration of stay  If discussed at Long Length of Stay Meetings, dates discussed:    Comments:  12/30/14- 1600- Donn PieriniKristi Avice Funchess RN, bSN  [phone] pt with order for home 02- [facility] to f/u and bring tank to room prior to discharge- pt also going home on Xarelto- attempted benefits check- unable to complete- pt does not have insurance card with him- pt given $0 copay - savings card that can be used up to 12 mo.

## 2014-12-30 NOTE — Progress Notes (Signed)
Utilization review completed.  

## 2014-12-30 NOTE — Progress Notes (Signed)
SATURATION QUALIFICATIONS: (This note is used to comply with regulatory documentation for home oxygen)  Patient Saturations on Room Air at Rest = 90%  Patient Saturations on Room Air while Ambulating = 85%  Patient Saturations on 3 Liters of oxygen while Ambulating = 90%  Please briefly explain why patient needs home oxygen: pt with acute PE

## 2014-12-30 NOTE — Discharge Summary (Signed)
Discharge Summary  [name] Berg:[id number] DOB: 01/29/1966  PCP: Irena Reichmann, DO  Admit date: 12/27/2014 Discharge date: 12/30/2014  Time spent: 25 minutes  Recommendations for Outpatient Follow-up:  1. New medication: Xarelto 15 mg by mouth twice a day 17 days, followed by Xarelto 20 mg by mouth daily for total of 6 months 2. New medication: Lopressor 100 mg by mouth twice a day, to be titrated down as oxygen needs decreased and heart rate will come down naturally 3. Medication change: While patient is on this Lopressor at that dose, his Hyzaar will be held 4. Patient will follow up with primary care physician in the next 2 weeks 5. Patient be discharged on oxygen 3 L nasal cannula at rest, 4 L with exertion  Discharge Diagnoses:  Active Hospital Problems   Diagnosis Date Noted  . Pulmonary embolus 12/27/2014  . HTN (hypertension) 12/27/2014  . Morbid obesity 12/27/2014  . A-fib 12/27/2014  . PE (pulmonary embolism) 12/27/2014  . Acute respiratory failure with hypoxia 12/27/2014    Resolved Hospital Problems   Diagnosis Date Noted Date Resolved  No resolved problems to display.    Discharge Condition: Improved, being discharged home  Diet recommendation: Low-sodium  Filed Weights   12/27/14 1432  Weight: 203.211 kg (448 lb)    History of present illness:  Patient is a 49 year old male with past medical history of recently diagnosed atrial fibrillation plus morbid obesity and hypertension who had one week of cough, dyspnea and came to [facility] emergency room on 1/8 and found to have pulmonary embolus on right side. Echocardiogram noted no evidence of cardiovascular compromise. Patient went into rapid age or fibrillation requiring IV Cardizem, but also requiring been supplemental oxygen by facemask. Patient transferred to [facility] stepdown on 1/9.  Hospital Course:  Principal Problem:   Acute respiratory failure with hypoxia secondary to Pulmonary embolus:  Patient initially given Lovenox at [facility] prior to admission. Without any contraindications, he was started on Xarelto and has been able to tolerate this. He will be discharged on starter pack followed by 20 mg by mouth daily with 5 refills to cover entire 6 month course. Patient still required significant oxygen by facemask. With use of inspirometer, gentle diuresis of likely some mild volume overload and nebulizer treatments, patient able to be weaned down to 3 L nasal cannula. Attempts to wean him down further lead to oxygen desaturation. Patient is able to maintain oxygen saturations at rest of 90-92% on 3 L and he goes down to 84% on 2 L. Patient is comfortable. Plan will be for patient discharged on 3 L nasal cannula at rest and 4 L with exertion. Over the next few weeks to months, by his PCP, patient can be worked to wean off of oxygen altogether.   Lower extremity Dopplers confirmed bilateral DVTs. Suspect likely cause is patient being laid up following CT surgery. After 6 months, at the discretion of his PCP, patient could have hypercoagulable workup done after being off of Xarelto for 2 weeks. Unable to do hypercoagulable workup here given acute presence of clot plus patient already got Lovenox prior to transfer.  Active Problems:   HTN (hypertension): Patient at home on ARB/HCTZ. Because of need for increasing rate control agents, this medication will temporarily be stopped and can be resumed at the discretion of his primary care doctor.    Morbid obesity: Patient is criteria with BMI greater than 40    A-fib: Given pulmonary embolus, patient's heart rate  initially required Cardizem drip and then with increasing doses of by mouth metoprolol plus diuresis, drip able to be weaned off. Patient will be discharged on metoprolol 100 mg by mouth twice a day. In time as patient's oxygen needs decreased, metoprolol can be titrated down as well.    Procedures:  Lower extremity Dopplers  done 1/10: Positive for DVT involving right posterior tibial, right peroneal, left femoral and left peroneal veins  Echocardiogram done at [facility] on 1/8: No evidence of systolic or diastolic heart failure, mild tricuspid and mitral regurg  Consultations:  None  Discharge Exam: BP 130/82 mmHg  Pulse 99  Temp(Src) 98.3 F (36.8 C) (Oral)  Resp 20  Ht  (1.854 m)  Wt 203.211 kg (448 lb)  BMI 59.12 kg/m2  SpO2 92%  General: Alert and oriented 3, no acute distress Cardiovascular: Irregular rhythm, rate controlled Respiratory: Decreased breath sounds throughout secondary to body habitus  Discharge Instructions You were cared for by a hospitalist during your hospital stay. If you have any questions about your discharge medications or the care you received while you were in the hospital after you are discharged, you can call the unit and asked to speak with the hospitalist on call if the hospitalist that took care of you is not available. Once you are discharged, your primary care physician will handle any further medical issues. Please note that NO REFILLS for any discharge medications will be authorized once you are discharged, as it is imperative that you return to your primary care physician (or establish a relationship with a primary care physician if you do not have one) for your aftercare needs so that they can reassess your need for medications and monitor your lab values.  Discharge Instructions    Diet - low sodium heart healthy    Complete by:  As directed      Increase activity slowly    Complete by:  As directed             Medication List    STOP taking these medications        ibuprofen 200 MG tablet  Commonly known as:  ADVIL,MOTRIN     levofloxacin 750 MG tablet  Commonly known as:  LEVAQUIN     losartan-hydrochlorothiazide 50-12.5 MG per tablet  Commonly known as:  HYZAAR      TAKE these medications        HYDROcodone-acetaminophen 5-325 MG  per tablet  Commonly known as:  NORCO/VICODIN  Take 1 tablet by mouth every 4 (four) hours as needed (pain).     HYDROcodone-homatropine 5-1.5 MG/5ML syrup  Commonly known as:  HYCODAN  Take 5 mLs by mouth every 4 (four) hours as needed for cough.     metoprolol 100 MG tablet  Commonly known as:  LOPRESSOR  Take 1 tablet (100 mg total) by mouth 2 (two) times daily.     mupirocin ointment 2 %  Commonly known as:  BACTROBAN  Apply 1 application topically 2 (two) times daily. Apply to nostrils     PROAIR HFA 108 (90 BASE) MCG/ACT inhaler  Generic drug:  albuterol  Inhale 2 puffs into the lungs every 4 (four) hours as needed for wheezing or shortness of breath.     Rivaroxaban 15 & 20 MG Tbpk  Commonly known as:  XARELTO STARTER PACK  Take as directed on package: Start with one  tablet by mouth twice a day with food. On Day 22, switch to one   tablet once a day with food.     rivaroxaban 20 MG Tabs tablet  Commonly known as:  XARELTO  20mg  po daily with supper starting in 30 days.       No Known Allergies     Follow-up Information    Follow up with COLLINS, DANA, DO In 2 weeks.   Specialty:  Family Medicine   Contact information:   [address] [place] [address] [phone]        The results of significant diagnostics from this hospitalization (including imaging, microbiology, ancillary and laboratory) are listed below for reference.    Significant Diagnostic Studies: No results found.  Microbiology: Recent Results (from the past 240 hour(s))  MRSA PCR Screening     Status: Abnormal   Collection Time: 12/27/14  3:10 PM  Result Value Ref Range Status   MRSA by PCR POSITIVE (A) NEGATIVE Final    Comment:        The GeneXpert MRSA Assay (FDA approved for NASAL specimens only), is one component of a comprehensive MRSA colonization surveillance program. It is not intended to diagnose MRSA infection nor to guide or monitor treatment for MRSA  infections. RESULT CALLED TO, READ BACK BY AND VERIFIED WITH: GRACOU,R RN 12/27/14 1923 WOOTEN,K      Labs: Basic Metabolic Panel:  Recent Labs Lab 12/28/14 0400 12/29/14 0312  NA 141 136  K 4.6 5.0  CL 103 101  CO2 29 32  GLUCOSE 102* 91  BUN 11 15  CREATININE 0.82 0.88  CALCIUM 9.0 8.4   Liver Function Tests:  Recent Labs Lab 12/28/14 0400  AST 35  ALT 29  ALKPHOS 64  BILITOT 0.4  PROT 7.1  ALBUMIN 3.2*   No results for input(s): LIPASE, AMYLASE in the last 168 hours. No results for input(s): AMMONIA in the last 168 hours. CBC:  Recent Labs Lab 12/28/14 0400  WBC 7.4  HGB 14.3  HCT 44.3  MCV 102.1*  PLT PLATELET CLUMPS NOTED ON SMEAR, COUNT APPEARS ADEQUATE   Cardiac Enzymes:  Recent Labs Lab 12/28/14 0400  TROPONINI <0.03   BNP: BNP (last 3 results) No results for input(s): PROBNP in the last 8760 hours. CBG: No results for input(s): GLUCAP in the last 168 hours.     Signed:  Hollice EspyKRISHNAN,Athira Janowicz K  [facility] 12/30/2014, 2:27 PM

## 2014-12-30 NOTE — Progress Notes (Signed)
When pt was wean from 3L to 2L his sat dropped to upper 80's when pt dozes off to sleep; 02 increase back to 3L and pt sat in the low 90's will continue to monitor pt quietly. Arabella MerlesP. Amo Venus Gilles RN.

## 2018-12-11 DIAGNOSIS — R7301 Impaired fasting glucose: Secondary | ICD-10-CM | POA: Insufficient documentation

## 2021-01-07 ENCOUNTER — Inpatient Hospital Stay (HOSPITAL_COMMUNITY)
Admission: EM | Admit: 2021-01-07 | Discharge: 2021-01-13 | DRG: 177 | Disposition: A | Discharge status: Home / Self Care | Payer: BC Managed Care – PPO | Attending: Internal Medicine | Admitting: Internal Medicine

## 2021-01-07 ENCOUNTER — Emergency Department (HOSPITAL_COMMUNITY): Payer: BC Managed Care – PPO

## 2021-01-07 ENCOUNTER — Encounter (HOSPITAL_COMMUNITY): Payer: Self-pay | Admitting: Emergency Medicine

## 2021-01-07 ENCOUNTER — Other Ambulatory Visit: Payer: Self-pay

## 2021-01-07 DIAGNOSIS — Z6841 Body Mass Index (BMI) 40.0 and over, adult: Secondary | ICD-10-CM | POA: Diagnosis not present

## 2021-01-07 DIAGNOSIS — U071 COVID-19: Principal | ICD-10-CM

## 2021-01-07 DIAGNOSIS — R0902 Hypoxemia: Secondary | ICD-10-CM

## 2021-01-07 DIAGNOSIS — J9601 Acute respiratory failure with hypoxia: Secondary | ICD-10-CM

## 2021-01-07 DIAGNOSIS — D696 Thrombocytopenia, unspecified: Secondary | ICD-10-CM | POA: Diagnosis present

## 2021-01-07 DIAGNOSIS — R739 Hyperglycemia, unspecified: Secondary | ICD-10-CM | POA: Diagnosis present

## 2021-01-07 DIAGNOSIS — Z8249 Family history of ischemic heart disease and other diseases of the circulatory system: Secondary | ICD-10-CM | POA: Diagnosis not present

## 2021-01-07 DIAGNOSIS — J1282 Pneumonia due to coronavirus disease 2019: Secondary | ICD-10-CM | POA: Diagnosis not present

## 2021-01-07 DIAGNOSIS — R7401 Elevation of levels of liver transaminase levels: Secondary | ICD-10-CM

## 2021-01-07 DIAGNOSIS — Z7901 Long term (current) use of anticoagulants: Secondary | ICD-10-CM

## 2021-01-07 DIAGNOSIS — T380X5A Adverse effect of glucocorticoids and synthetic analogues, initial encounter: Secondary | ICD-10-CM | POA: Diagnosis present

## 2021-01-07 DIAGNOSIS — I10 Essential (primary) hypertension: Secondary | ICD-10-CM | POA: Diagnosis present

## 2021-01-07 DIAGNOSIS — I482 Chronic atrial fibrillation, unspecified: Secondary | ICD-10-CM | POA: Diagnosis present

## 2021-01-07 DIAGNOSIS — Z86711 Personal history of pulmonary embolism: Secondary | ICD-10-CM

## 2021-01-07 DIAGNOSIS — E876 Hypokalemia: Secondary | ICD-10-CM | POA: Diagnosis not present

## 2021-01-07 DIAGNOSIS — Z79899 Other long term (current) drug therapy: Secondary | ICD-10-CM | POA: Diagnosis not present

## 2021-01-07 HISTORY — DX: Acute respiratory failure with hypoxia: J96.01

## 2021-01-07 LAB — COMPREHENSIVE METABOLIC PANEL
ALT: 37 U/L (ref 0–44)
AST: 45 U/L — ABNORMAL HIGH (ref 15–41)
Albumin: 2.5 g/dL — ABNORMAL LOW (ref 3.5–5.0)
Alkaline Phosphatase: 50 U/L (ref 38–126)
Anion gap: 13 (ref 5–15)
BUN: 7 mg/dL (ref 6–20)
CO2: 26 mmol/L (ref 22–32)
Calcium: 8 mg/dL — ABNORMAL LOW (ref 8.9–10.3)
Chloride: 96 mmol/L — ABNORMAL LOW (ref 98–111)
Creatinine, Ser: 0.8 mg/dL (ref 0.61–1.24)
GFR, Estimated: >60 mL/min (ref >60)
Glucose, Bld: 135 mg/dL — ABNORMAL HIGH (ref 70–99)
Potassium: 3.4 mmol/L — ABNORMAL LOW (ref 3.5–5.1)
Sodium: 135 mmol/L (ref 135–145)
Total Bilirubin: 1.3 mg/dL — ABNORMAL HIGH (ref 0.3–1.2)
Total Protein: 6.7 g/dL (ref 6.5–8.1)

## 2021-01-07 LAB — CBC WITH DIFFERENTIAL/PLATELET
Abs Immature Granulocytes: 0.05 10*3/uL (ref 0.00–0.07)
Basophils Absolute: 0 10*3/uL (ref 0.0–0.1)
Basophils Relative: 0 %
Eosinophils Absolute: 0.1 10*3/uL (ref 0.0–0.5)
Eosinophils Relative: 1 %
HCT: 47.6 % (ref 39.0–52.0)
Hemoglobin: 15.8 g/dL (ref 13.0–17.0)
Immature Granulocytes: 1 %
Lymphocytes Relative: 17 %
Lymphs Abs: 1.4 10*3/uL (ref 0.7–4.0)
MCH: 32 pg (ref 26.0–34.0)
MCHC: 33.2 g/dL (ref 30.0–36.0)
MCV: 96.4 fL (ref 80.0–100.0)
Monocytes Absolute: 0.6 10*3/uL (ref 0.1–1.0)
Monocytes Relative: 8 %
Neutro Abs: 5.7 10*3/uL (ref 1.7–7.7)
Neutrophils Relative %: 73 %
Platelets: 91 10*3/uL — ABNORMAL LOW (ref 150–400)
RBC: 4.94 MIL/uL (ref 4.22–5.81)
RDW: 13.1 % (ref 11.5–15.5)
WBC: 7.9 10*3/uL (ref 4.0–10.5)
nRBC: 0 % (ref 0.0–0.2)

## 2021-01-07 LAB — FIBRINOGEN
Fibrinogen: 647 mg/dL — ABNORMAL HIGH (ref 210–475)
Fibrinogen: 671 mg/dL — ABNORMAL HIGH (ref 210–475)

## 2021-01-07 LAB — FERRITIN
Ferritin: 939 ng/mL — ABNORMAL HIGH (ref 24–336)
Ferritin: 801 ng/mL — ABNORMAL HIGH (ref 24–336)

## 2021-01-07 LAB — D-DIMER, QUANTITATIVE
D-Dimer, Quant: 1.36 ug/mL-FEU — ABNORMAL HIGH (ref 0.00–0.50)
D-Dimer, Quant: 1.5 ug/mL-FEU — ABNORMAL HIGH (ref 0.00–0.50)

## 2021-01-07 LAB — LACTIC ACID, PLASMA
Lactic Acid, Venous: 2.2 mmol/L (ref 0.5–1.9)
Lactic Acid, Venous: 2.1 mmol/L (ref 0.5–1.9)

## 2021-01-07 LAB — C-REACTIVE PROTEIN
CRP: 12.6 mg/dL — ABNORMAL HIGH (ref <1.0)
CRP: 12.9 mg/dL — ABNORMAL HIGH (ref <1.0)

## 2021-01-07 LAB — TRIGLYCERIDES: Triglycerides: 75 mg/dL (ref <150)

## 2021-01-07 LAB — HIV ANTIBODY (ROUTINE TESTING W REFLEX): HIV Screen 4th Generation wRfx: NONREACTIVE

## 2021-01-07 LAB — PROCALCITONIN
Procalcitonin: <0.1 ng/mL
Procalcitonin: <0.1 ng/mL

## 2021-01-07 LAB — LACTATE DEHYDROGENASE
LDH: 425 U/L — ABNORMAL HIGH (ref 98–192)
LDH: 448 U/L — ABNORMAL HIGH (ref 98–192)

## 2021-01-07 LAB — BRAIN NATRIURETIC PEPTIDE: B Natriuretic Peptide: 431.6 pg/mL — ABNORMAL HIGH (ref 0.0–100.0)

## 2021-01-07 LAB — TROPONIN I (HIGH SENSITIVITY): Troponin I (High Sensitivity): 7 ng/L (ref <18)

## 2021-01-07 MED ORDER — HYDROCOD POLST-CPM POLST ER 10-8 MG/5ML PO SUER
5.0000 mL | Freq: Two times a day (BID) | ORAL | Status: DC | PRN
  Administered 2021-01-08: 5 mL via ORAL
  Filled 2021-01-07 (×2): qty 5

## 2021-01-07 MED ORDER — HYDROCODONE-HOMATROPINE 5-1.5 MG/5ML PO SYRP: 5.0000 mL | ORAL_SOLUTION | ORAL | Status: DC | PRN

## 2021-01-07 MED ORDER — GUAIFENESIN ER 600 MG PO TB12
600.0000 mg | ORAL_TABLET | Freq: Two times a day (BID) | ORAL | Status: DC
  Administered 2021-01-07 – 2021-01-13 (×12): 600 mg via ORAL
  Filled 2021-01-07 (×12): qty 1

## 2021-01-07 MED ORDER — METHYLPREDNISOLONE SODIUM SUCC 1000 MG IJ SOLR
1.0000 mg/kg | Freq: Two times a day (BID) | INTRAMUSCULAR | Status: DC
  Administered 2021-01-07 – 2021-01-08 (×3): 200 mg via INTRAVENOUS
  Filled 2021-01-07 (×6): qty 1.6

## 2021-01-07 MED ORDER — ONDANSETRON HCL 4 MG PO TABS: 4.0000 mg | ORAL_TABLET | Freq: Four times a day (QID) | ORAL | Status: DC | PRN

## 2021-01-07 MED ORDER — METOPROLOL TARTRATE 100 MG PO TABS
100.0000 mg | ORAL_TABLET | Freq: Two times a day (BID) | ORAL | Status: DC
Start: 2021-01-07 — End: 2021-01-13
  Administered 2021-01-07 – 2021-01-13 (×12): 100 mg via ORAL
  Filled 2021-01-07: qty 1
  Filled 2021-01-07: qty 4
  Filled 2021-01-07 (×5): qty 1
  Filled 2021-01-07: qty 4
  Filled 2021-01-07 (×4): qty 1

## 2021-01-07 MED ORDER — SODIUM CHLORIDE 0.9 % IV SOLN
100.0000 mg | Freq: Every day | INTRAVENOUS | Status: AC
  Administered 2021-01-08 – 2021-01-11 (×4): 100 mg via INTRAVENOUS
  Filled 2021-01-07 (×4): qty 20

## 2021-01-07 MED ORDER — ZINC SULFATE 220 (50 ZN) MG PO CAPS
220.0000 mg | ORAL_CAPSULE | Freq: Every day | ORAL | Status: DC
  Administered 2021-01-07 – 2021-01-13 (×7): 220 mg via ORAL
  Filled 2021-01-07 (×8): qty 1

## 2021-01-07 MED ORDER — VITAMIN D 25 MCG (1000 UNIT) PO TABS
1000.0000 [IU] | ORAL_TABLET | Freq: Every day | ORAL | Status: DC
  Administered 2021-01-07 – 2021-01-13 (×7): 1000 [IU] via ORAL
  Filled 2021-01-07 (×7): qty 1

## 2021-01-07 MED ORDER — SODIUM CHLORIDE 0.9 % IV SOLN
200.0000 mg | Freq: Once | INTRAVENOUS | Status: AC
  Administered 2021-01-07: 200 mg via INTRAVENOUS
  Filled 2021-01-07: qty 40

## 2021-01-07 MED ORDER — TRAZODONE HCL 50 MG PO TABS
25.0000 mg | ORAL_TABLET | Freq: Every evening | ORAL | Status: DC | PRN
  Filled 2021-01-07: qty 1

## 2021-01-07 MED ORDER — ONDANSETRON HCL 4 MG/2ML IJ SOLN: 4.0000 mg | Freq: Four times a day (QID) | INTRAMUSCULAR | Status: DC | PRN

## 2021-01-07 MED ORDER — RIVAROXABAN 20 MG PO TABS
20.0000 mg | ORAL_TABLET | Freq: Every day | ORAL | Status: DC
  Administered 2021-01-08 – 2021-01-13 (×6): 20 mg via ORAL
  Filled 2021-01-07 (×8): qty 1

## 2021-01-07 MED ORDER — SODIUM CHLORIDE 0.9 % IV SOLN: INTRAVENOUS | Status: DC

## 2021-01-07 MED ORDER — PREDNISONE 5 MG PO TABS: 50.0000 mg | ORAL_TABLET | Freq: Every day | ORAL | Status: DC

## 2021-01-07 MED ORDER — BARICITINIB 2 MG PO TABS
4.0000 mg | ORAL_TABLET | Freq: Every day | ORAL | Status: DC
  Administered 2021-01-08 – 2021-01-13 (×6): 4 mg via ORAL
  Filled 2021-01-07 (×8): qty 2

## 2021-01-07 MED ORDER — ACETAMINOPHEN 325 MG PO TABS: 650.0000 mg | ORAL_TABLET | Freq: Four times a day (QID) | ORAL | Status: DC | PRN

## 2021-01-07 MED ORDER — MAGNESIUM HYDROXIDE 400 MG/5ML PO SUSP
30.0000 mL | Freq: Every day | ORAL | Status: DC | PRN
  Filled 2021-01-07: qty 30

## 2021-01-07 MED ORDER — DEXAMETHASONE SODIUM PHOSPHATE 10 MG/ML IJ SOLN
6.0000 mg | Freq: Once | INTRAMUSCULAR | Status: AC
  Administered 2021-01-07: 6 mg via INTRAVENOUS
  Filled 2021-01-07: qty 1

## 2021-01-07 MED ORDER — FAMOTIDINE 20 MG PO TABS
20.0000 mg | ORAL_TABLET | Freq: Two times a day (BID) | ORAL | Status: DC
  Administered 2021-01-07 – 2021-01-13 (×12): 20 mg via ORAL
  Filled 2021-01-07 (×12): qty 1

## 2021-01-07 MED ORDER — ASPIRIN EC 81 MG PO TBEC: 81.0000 mg | DELAYED_RELEASE_TABLET | Freq: Every day | ORAL | Status: DC

## 2021-01-07 MED ORDER — ASCORBIC ACID 500 MG PO TABS
500.0000 mg | ORAL_TABLET | Freq: Every day | ORAL | Status: DC
  Administered 2021-01-08 – 2021-01-13 (×6): 500 mg via ORAL
  Filled 2021-01-07 (×6): qty 1

## 2021-01-07 MED ORDER — GUAIFENESIN-DM 100-10 MG/5ML PO SYRP
10.0000 mL | ORAL_SOLUTION | ORAL | Status: DC | PRN
  Administered 2021-01-08: 10 mL via ORAL
  Filled 2021-01-07: qty 10

## 2021-01-07 NOTE — ED Provider Notes (Signed)
[facility] EMERGENCY DEPARTMENT Provider Note   CSN: [id number] Arrival date & time: 01/07/21  1551     History Chief Complaint  Patient presents with  . Covid Positive    [name] Berg is a 55 y.o. male presented for evaluation of shortness of breath.  Patient states he has a positive for COVID on the 13th, 7 days ago.  His symptoms actually began up closer to 2 weeks ago.  He states he has nasal congestion, nonproductive cough, and shortness of breath.  His symptoms have been worsening the past few days.  He has been checking his oxygen at home, and has been in the 80s the past day or 2.  This is what prompted his ER visit.  He reports feeling tired.  No fevers, chills, chest pain, nausea, vomiting, abdominal pain, urinary symptoms, normal bowel movements.  He denies history of lung problems such as COPD or asthma.  He does have a history of PE, is on Xarelto.  History of A. fib, hypertension, obesity.  He is unvaccinated for COVID.  He denies sick contacts.  Denies tobacco, alcohol, or drug use.  HPI     History reviewed. No pertinent past medical history.  Patient Active Problem List   Diagnosis Date Noted  . HTN (hypertension) 12/27/2014  . Morbid obesity (HCC) 12/27/2014  . A-fib (HCC) 12/27/2014  . Pulmonary embolus (HCC) 12/27/2014  . PE (pulmonary embolism) 12/27/2014  . Acute respiratory failure with hypoxia (HCC) 12/27/2014    History reviewed. No pertinent surgical history.     No family history on file.     Home Medications Prior to Admission medications   Medication Sig Start Date End Date Taking? Authorizing Provider  albuterol (PROAIR HFA) 108 (90 BASE) MCG/ACT inhaler Inhale 2 puffs into the lungs every 4 (four) hours as needed for wheezing or shortness of breath.    [provider]  HYDROcodone-acetaminophen (NORCO/VICODIN) 5-325 MG per tablet Take 1 tablet by mouth every 4 (four) hours as needed (pain).  11/21/14   [provider]  HYDROcodone-homatropine (HYCODAN) 5-1.5 MG/5ML syrup Take 5 mLs by mouth every 4 (four) hours as needed for cough.    [provider]  metoprolol tartrate (LOPRESSOR) 100 MG tablet Take 1 tablet (100 mg total) by mouth 2 (two) times daily. 12/30/14   Hollice Espy, MD  mupirocin ointment (BACTROBAN) 2 % Apply 1 application topically 2 (two) times daily. Apply to nostrils 12/01/14   [provider]  Rivaroxaban (XARELTO STARTER PACK) 15 & 20 MG TBPK Take as directed on package: Start with one 15mg  tablet by mouth twice a day with food. On Day 22, switch to one 20mg  tablet once a day with food. 12/30/14   , MD  rivaroxaban (XARELTO) 20 MG TABS tablet 20mg  po daily with supper starting in 30 days. 12/30/14   Hollice Espy, MD    Allergies    Patient has no known allergies.  Review of Systems   Review of Systems  HENT: Positive for congestion.   Respiratory: Positive for cough and shortness of breath.   Neurological: Positive for weakness.  All other systems reviewed and are negative.   Physical Exam Updated Vital Signs BP 140/68 (BP Location: Right Arm)   Pulse 90   Temp 98.9 F (37.2 C) (Oral)   Resp (!) 30   Ht 6\' 2"  (1.88 m)   Wt (!) 203.2 kg   SpO2 91%   BMI 57.52  kg/m   Physical Exam Vitals and nursing note reviewed.  Constitutional:      General: He is not in acute distress.    Appearance: He is well-developed and well-nourished. He is obese.     Comments: Appears ill, not in acute distress  HENT:     Head: Normocephalic and atraumatic.  Eyes:     Extraocular Movements: Extraocular movements intact and EOM normal.     Conjunctiva/sclera: Conjunctivae normal.     Pupils: Pupils are equal, round, and reactive to light.  Cardiovascular:     Rate and Rhythm: Normal rate and regular rhythm.     Pulses: Normal pulses and intact distal pulses.  Pulmonary:     Effort: Pulmonary effort is normal. Tachypnea present.  No respiratory distress.     Breath sounds: Normal breath sounds. No wheezing.     Comments: Speaking in full sentences. Mildly tachypneic.   Lung sounds normal on my exam, however exam limited due to body habitus.  SPO2 between 85 and 89% on room air, improved to 91% on 3 L nasal cannula. Abdominal:     General: There is no distension.     Palpations: Abdomen is soft. There is no mass.     Tenderness: There is no abdominal tenderness. There is no guarding or rebound.  Musculoskeletal:        General: Normal range of motion.     Cervical back: Normal range of motion and neck supple.  Skin:    General: Skin is warm and dry.     Capillary Refill: Capillary refill takes less than 2 seconds.  Neurological:     Mental Status: He is alert and oriented to person, place, and time.  Psychiatric:        Mood and Affect: Mood and affect normal.     ED Results / Procedures / Treatments   Labs (all labs ordered are listed, but only abnormal results are displayed) Labs Reviewed  LACTIC ACID, PLASMA - Abnormal; Notable for the following components:      Result Value   Lactic Acid, Venous 2.2 (*)    All other components within normal limits  CBC WITH DIFFERENTIAL/PLATELET - Abnormal; Notable for the following components:   Platelets 91 (*)    All other components within normal limits  COMPREHENSIVE METABOLIC PANEL - Abnormal; Notable for the following components:   Potassium 3.4 (*)    Chloride 96 (*)    Glucose, Bld 135 (*)    Calcium 8.0 (*)    Albumin 2.5 (*)    AST 45 (*)    Total Bilirubin 1.3 (*)    All other components within normal limits  D-DIMER, QUANTITATIVE (NOT AT [facility]) - Abnormal; Notable for the following components:   D-Dimer, Quant 1.36 (*)    All other components within normal limits  LACTATE DEHYDROGENASE - Abnormal; Notable for the following components:   LDH 425 (*)    All other components within normal limits  FERRITIN - Abnormal; Notable for the following  components:   Ferritin 939 (*)    All other components within normal limits  FIBRINOGEN - Abnormal; Notable for the following components:   Fibrinogen 647 (*)    All other components within normal limits  C-REACTIVE PROTEIN - Abnormal; Notable for the following components:   CRP 12.6 (*)    All other components within normal limits  CULTURE, BLOOD (ROUTINE X 2)  CULTURE, BLOOD (ROUTINE X 2)  PROCALCITONIN  TRIGLYCERIDES  LACTIC ACID,  PLASMA    EKG None  Radiology DG Chest Portable 1 View  Result Date: 01/07/2021 CLINICAL DATA:  COVID positive with shortness of breath. EXAM: PORTABLE CHEST 1 VIEW COMPARISON:  December 26, 2014 FINDINGS: Marked severity multifocal infiltrates are seen throughout both lungs. There is no evidence of a pleural effusion or pneumothorax. The heart size and mediastinal contours are within normal limits. The visualized skeletal structures are unremarkable. IMPRESSION: Marked severity bilateral multifocal infiltrates. Electronically Signed   By: Aram Candela M.D.   On: 01/07/2021 16:19    Procedures Procedures (including critical care time)  Medications Ordered in ED Medications  dexamethasone (DECADRON) injection 6 mg (has no administration in time range)    ED Course  I have reviewed the triage vital signs and the nursing notes.  Pertinent labs & imaging results that were available during my care of the patient were reviewed by me and considered in my medical decision making (see chart for details).    MDM Rules/Calculators/A&P                          Patient presenting for evaluation of shortness of breath and low oxygen in the setting of COVID infection.  On exam, patient appears ill, but not in acute distress.  He is mildly tachypneic, requiring oxygen via nasal cannula.  However not in respiratory distress/failure.  He is on Xarelto, as such, doubt blood clot.  Consider secondary bacterial infection, however patient without new fever, as  such shortness of breath is likely due to COVID.  Will obtain COVID labs, chest x-ray.  Patient will likely need to be admitted due to hypoxia.  Chest x-ray viewed and interpreted by me, shows multifocal pneumonia.  Labs show elevated inflammatory markers, consistent with COVID. Will call for admission. Decadron ordered.   Discussed with Dr. Arville Care from [facility], pt to be admitted.   Tiny Rietz was evaluated in Emergency Department on 01/07/2021 for the symptoms described in the history of present illness. He was evaluated in the context of the global COVID-19 pandemic, which necessitated consideration that the patient might be at risk for infection with the SARS-CoV-2 virus that causes COVID-19. Institutional protocols and algorithms that pertain to the evaluation of patients at risk for COVID-19 are in a state of rapid change based on information released by regulatory bodies including the CDC and federal and state organizations. These policies and algorithms were followed during the patient's care in the ED.   Final Clinical Impression(s) / ED Diagnoses Final diagnoses:  COVID-19  Pneumonia due to COVID-19 virus  Hypoxia    Rx / DC Orders ED Discharge Orders    None       Alveria Apley, PA-C 01/07/21 2027    Virgina Norfolk, DO 01/07/21 2028

## 2021-01-07 NOTE — H&P (Signed)
[place]   PATIENT NAME: [name] Berg    MR#:  [id number]  DATE OF BIRTH:  [birth year]  DATE OF ADMISSION:  01/07/2021  PRIMARY CARE PHYSICIAN: Irena Reichmann, DO   REQUESTING/REFERRING PHYSICIAN: Caccavale, Sophia, PA-C  CHIEF COMPLAINT:   Chief Complaint  Patient presents with  . Covid Positive    HISTORY OF PRESENT ILLNESS:  [name] Berg  is a 55 y.o.Caucasian male with a known history of obesity, hypertension and chronic atrial fibrillation on anticoagulation with Xarelto, who presented to the emergency room with acute onset of worsening dyspnea with associated dry cough and a tickly throat with occasional wheezing.  Symptoms started couple weeks ago with postnasal drip and generalized weakness he denied loss of taste or smell however he stated that he has been very sensitive to salt.  No nausea or vomiting or diarrhea.  No chest pain or palpitations.  He denies any fever or chills.  Today's pulse oximetry was 85 to 89% on room air and up to 91% on 2 L of O2 by nasal cannula.  He has not been vaccinated for COVID-19 and regrets it.  Upon presentation to the emergency room, respiratory rate was 24 and blood pressure was 132/94 with temperature of 98.8 and pulse and has been ranging from 90 to 93% on 2- 3 L O2 by nasal cannula.  Labs revealed mild hypokalemia and AST 45 with BNP of 431.6.  LDH was 425 and ferritin 939 with a CRP of 12.6 and later 12.9 lactic acid of 2.2 and later 2.1 with procalcitonin of less than 0.1.  CBC showed thrombocytopenia and D-dimer was 1.36 and later 1.5 with fibrinogen 647. EKG showed atrial fibrillation with controlled ventricular sponsor of 90 with right axis deviation and low voltage QRS with prolonged QT interval (QTC 519 MS. chest x-ray showed marked severity bilateral multifocal infiltrates.  The patient was given IV remdesivir.  He will be admitted to a medical monitored telemetry bed for further evaluation and management. PAST MEDICAL HISTORY:   Hypertension, morbid obesity, chronic atrial fibrillation on Xarelto.  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  SOCIAL HISTORY:   Social History   Tobacco Use  . Smoking status: Not on file  . Smokeless tobacco: Not on file  Substance Use Topics  . Alcohol use: Not on file  Patient denies any history of tobacco EtOH abuse or illicit drug use.  FAMILY HISTORY:  Positive for myocardial infarction in his mother DRUG ALLERGIES:  No Known Allergies  REVIEW OF SYSTEMS:   ROS As per history of present illness. All pertinent systems were reviewed above. Constitutional, HEENT, cardiovascular, respiratory, GI, GU, musculoskeletal, neuro, psychiatric, endocrine, integumentary and hematologic systems were reviewed and are otherwise negative/unremarkable except for positive findings mentioned above in the HPI.   MEDICATIONS AT HOME:   Prior to Admission medications   Medication Sig Start Date End Date Taking? Authorizing Provider  albuterol (PROAIR HFA) 108 (90 BASE) MCG/ACT inhaler Inhale 2 puffs into the lungs every 4 (four) hours as needed for wheezing or shortness of breath.    [provider]  HYDROcodone-acetaminophen (NORCO/VICODIN) 5-325 MG per tablet Take 1 tablet by mouth every 4 (four) hours as needed (pain).  11/21/14   [provider]  HYDROcodone-homatropine (HYCODAN) 5-1.5 MG/5ML syrup Take 5 mLs by mouth every 4 (four) hours as needed for cough.    [provider]  metoprolol tartrate (LOPRESSOR) 100 MG tablet Take 1 tablet (  100 mg total) by mouth 2 (two) times daily. 12/30/14   Hollice Espy, MD  mupirocin ointment (BACTROBAN) 2 % Apply 1 application topically 2 (two) times daily. Apply to nostrils 12/01/14   [provider]  Rivaroxaban (XARELTO STARTER PACK) 15 & 20 MG TBPK Take as directed on package: Start with one 15mg  tablet by mouth twice a day with food. On Day 22, switch to one 20mg  tablet once a  day with food. 12/30/14   , MD  rivaroxaban (XARELTO) 20 MG TABS tablet 20mg  po daily with supper starting in 30 days. 12/30/14   Hollice Espy, MD      VITAL SIGNS:  Blood pressure 140/68, pulse 90, temperature 98.9 F (37.2 C), temperature source Oral, resp. rate (!) 30, height 6\' 2"  (1.88 m), weight (!) 203.2 kg, SpO2 91 %.  PHYSICAL EXAMINATION:  Physical Exam  GENERAL:  55 y.o.-year-old obese Caucasian male patient lying in the bed with mild respiratory distress with conversational dyspnea.   EYES: Pupils equal, round, reactive to light and accommodation. No scleral icterus. Extraocular muscles intact.  HEENT: Head atraumatic, normocephalic. Oropharynx and nasopharynx clear.  NECK:  Supple, no jugular venous distention. No thyroid enlargement, no tenderness.  LUNGS: Diminished bibasal breath sounds with bibasal and midlung zone crackles. CARDIOVASCULAR: Regular rate and rhythm, S1, S2 normal. No murmurs, rubs, or gallops.  ABDOMEN: Soft, nondistended, nontender. Bowel sounds present. No organomegaly or mass.  EXTREMITIES: No pedal edema, cyanosis, or clubbing.  NEUROLOGIC: Cranial nerves II through XII are intact. Muscle strength 5/5 in all extremities. Sensation intact. Gait not checked.  PSYCHIATRIC: The patient is alert and oriented x 3.  Normal affect and good eye contact. SKIN: No obvious rash, lesion, or ulcer.   LABORATORY PANEL:   CBC Recent Labs  Lab 01/07/21 1851  WBC 7.9  HGB 15.8  HCT 47.6  PLT 91*   ------------------------------------------------------------------------------------------------------------------  Chemistries  Recent Labs  Lab 01/07/21 1851  NA 135  K 3.4*  CL 96*  CO2 26  GLUCOSE 135*  BUN 7  CREATININE 0.80  CALCIUM 8.0*  AST 45*  ALT 37  ALKPHOS 50  BILITOT 1.3*   ------------------------------------------------------------------------------------------------------------------  Cardiac Enzymes No  results for input(s): TROPONINI in the last 168 hours. ------------------------------------------------------------------------------------------------------------------  RADIOLOGY:  DG Chest Portable 1 View  Result Date: 01/07/2021 CLINICAL DATA:  COVID positive with shortness of breath. EXAM: PORTABLE CHEST 1 VIEW COMPARISON:  December 26, 2014 FINDINGS: Marked severity multifocal infiltrates are seen throughout both lungs. There is no evidence of a pleural effusion or pneumothorax. The heart size and mediastinal contours are within normal limits. The visualized skeletal structures are unremarkable. IMPRESSION: Marked severity bilateral multifocal infiltrates. Electronically Signed   By: 01/09/21 M.D.   On: 01/07/2021 16:19      IMPRESSION AND PLAN:   1.  Acute hypoxemic respiratory failure secondary to COVID-19. -The patient will be admitted to a medically monitored isolation bed. -O2 protocol will be followed to keep O2 saturation above 93.   2.  Multifocal pneumonia secondary to COVID-19. -The patient will be admitted to an isolation monitored bed with droplet and contact precautions. -Given multifocal pneumonia we will empirically place the patient on IV Rocephin and Zithromax for possible bacterial superinfection only with elevated Procalcitonin. -The patient will be placed on scheduled Mucinex and as needed Tussionex. -We will avoid nebulization as much as we can, give bronchodilator MDI if needed, and with deterioration of oxygenation try to avoid  BiPAP/CPAP if possible.    -Will obtain sputum Gram stain culture and sensitivity and follow blood cultures. -O2 protocol will be followed. -We will follow CRP, ferritin, LDH and D-dimer. -Will follow manual differential for ANC/ALC ratio as well as follow troponin I and daily CBC with manual differential and CMP. - Will place the patient on IV Remdesivir and IV steroid therapy with IV Solu-Medrol with elevated inflammatory  markers. -The patient will be placed on vitamin D3, vitamin C, zinc sulfate, p.o. Pepcid and aspirin.  3.  Hypokalemia. - We will replace potassium and check magnesium level.  4.  Mildly elevated AST. - We will follow LFTs.  5.  Chronic atrial fibrillation on Xarelto. - We will continue his Xarelto and Lopressor.  6.  Essential hypertension. - We will continue his Hyzaar.  7.  DVT prophylaxis. The subtenons Lovenox.    All the records are reviewed and case discussed with ED provider. The plan of care was discussed in details with the patient (and family). I answered all questions. The patient agreed to proceed with the above mentioned plan. Further management will depend upon hospital course.   CODE STATUS: Full code  Status is: Inpatient  Remains inpatient appropriate because:Ongoing diagnostic testing needed not appropriate for outpatient work up, Unsafe d/c plan, IV treatments appropriate due to intensity of illness or inability to take PO and Inpatient level of care appropriate due to severity of illness   Dispo: The patient is from: Home              Anticipated d/c is to: Home              Anticipated d/c date is: > 3 days              Patient currently is not medically stable to d/c.  TOTAL TIME TAKING CARE OF THIS PATIENT: 55 minutes.    Hannah Beat M.D on 01/07/2021 at 8:29 PM  [facility]   From 7 PM-7 AM, contact night-coverage [email or web address]  CC: Primary care physician; Irena Reichmann, DO

## 2021-01-07 NOTE — ED Triage Notes (Signed)
Pt BIB EMS, covid+ 1/13, c/o increased cough, fatigue and shortness of breath. Initially 87% on room air, improved to 91% on 3L.

## 2021-01-08 DIAGNOSIS — I482 Chronic atrial fibrillation, unspecified: Secondary | ICD-10-CM

## 2021-01-08 LAB — COMPREHENSIVE METABOLIC PANEL
ALT: 36 U/L (ref 0–44)
AST: 42 U/L — ABNORMAL HIGH (ref 15–41)
Albumin: 2.4 g/dL — ABNORMAL LOW (ref 3.5–5.0)
Alkaline Phosphatase: 48 U/L (ref 38–126)
Anion gap: 13 (ref 5–15)
BUN: 10 mg/dL (ref 6–20)
CO2: 23 mmol/L (ref 22–32)
Calcium: 8 mg/dL — ABNORMAL LOW (ref 8.9–10.3)
Chloride: 98 mmol/L (ref 98–111)
Creatinine, Ser: 0.82 mg/dL (ref 0.61–1.24)
GFR, Estimated: >60 mL/min (ref >60)
Glucose, Bld: 312 mg/dL — ABNORMAL HIGH (ref 70–99)
Potassium: 4.4 mmol/L (ref 3.5–5.1)
Sodium: 134 mmol/L — ABNORMAL LOW (ref 135–145)
Total Bilirubin: 1.5 mg/dL — ABNORMAL HIGH (ref 0.3–1.2)
Total Protein: 6.6 g/dL (ref 6.5–8.1)

## 2021-01-08 LAB — FERRITIN: Ferritin: 767 ng/mL — ABNORMAL HIGH (ref 24–336)

## 2021-01-08 LAB — HEMOGLOBIN A1C
Hgb A1c MFr Bld: 6.6 % — ABNORMAL HIGH (ref 4.8–5.6)
Mean Plasma Glucose: 142.72 mg/dL

## 2021-01-08 LAB — CBC WITH DIFFERENTIAL/PLATELET
Abs Immature Granulocytes: 0.04 10*3/uL (ref 0.00–0.07)
Basophils Absolute: 0 10*3/uL (ref 0.0–0.1)
Basophils Relative: 0 %
Eosinophils Absolute: 0 10*3/uL (ref 0.0–0.5)
Eosinophils Relative: 0 %
HCT: 49 % (ref 39.0–52.0)
Hemoglobin: 15.7 g/dL (ref 13.0–17.0)
Immature Granulocytes: 1 %
Lymphocytes Relative: 12 %
Lymphs Abs: 0.7 10*3/uL (ref 0.7–4.0)
MCH: 31.7 pg (ref 26.0–34.0)
MCHC: 32 g/dL (ref 30.0–36.0)
MCV: 98.8 fL (ref 80.0–100.0)
Monocytes Absolute: 0.1 10*3/uL (ref 0.1–1.0)
Monocytes Relative: 2 %
Neutro Abs: 5 10*3/uL (ref 1.7–7.7)
Neutrophils Relative %: 85 %
Platelets: UNDETERMINED 10*3/uL (ref 150–400)
RBC: 4.96 MIL/uL (ref 4.22–5.81)
RDW: 12.9 % (ref 11.5–15.5)
WBC: 5.9 10*3/uL (ref 4.0–10.5)
nRBC: 0 % (ref 0.0–0.2)

## 2021-01-08 LAB — MRSA PCR SCREENING: MRSA by PCR: POSITIVE — AB

## 2021-01-08 LAB — C-REACTIVE PROTEIN: CRP: 13.1 mg/dL — ABNORMAL HIGH (ref <1.0)

## 2021-01-08 LAB — GLUCOSE, CAPILLARY
Glucose-Capillary: 309 mg/dL — ABNORMAL HIGH (ref 70–99)
Glucose-Capillary: 284 mg/dL — ABNORMAL HIGH (ref 70–99)

## 2021-01-08 LAB — D-DIMER, QUANTITATIVE: D-Dimer, Quant: 2.7 ug/mL-FEU — ABNORMAL HIGH (ref 0.00–0.50)

## 2021-01-08 LAB — TROPONIN I (HIGH SENSITIVITY): Troponin I (High Sensitivity): 5 ng/L (ref <18)

## 2021-01-08 MED ORDER — INSULIN ASPART 100 UNIT/ML ~~LOC~~ SOLN
0.0000 [IU] | Freq: Every day | SUBCUTANEOUS | Status: DC
  Administered 2021-01-08: 3 [IU] via SUBCUTANEOUS
  Administered 2021-01-09: 2 [IU] via SUBCUTANEOUS
  Administered 2021-01-11 – 2021-01-12 (×2): 3 [IU] via SUBCUTANEOUS

## 2021-01-08 MED ORDER — MUPIROCIN 2 % EX OINT
1.0000 "application " | TOPICAL_OINTMENT | Freq: Two times a day (BID) | CUTANEOUS | Status: AC
  Administered 2021-01-08 – 2021-01-13 (×10): 1 via NASAL
  Filled 2021-01-08 (×3): qty 22

## 2021-01-08 MED ORDER — CHLORHEXIDINE GLUCONATE CLOTH 2 % EX PADS
6.0000 | MEDICATED_PAD | Freq: Every day | CUTANEOUS | Status: DC
  Administered 2021-01-09 – 2021-01-13 (×4): 6 via TOPICAL

## 2021-01-08 MED ORDER — INSULIN ASPART 100 UNIT/ML ~~LOC~~ SOLN
0.0000 [IU] | Freq: Three times a day (TID) | SUBCUTANEOUS | Status: DC
  Administered 2021-01-08: 11 [IU] via SUBCUTANEOUS
  Administered 2021-01-09: 8 [IU] via SUBCUTANEOUS
  Administered 2021-01-09: 5 [IU] via SUBCUTANEOUS
  Administered 2021-01-09: 11 [IU] via SUBCUTANEOUS
  Administered 2021-01-10: 3 [IU] via SUBCUTANEOUS
  Administered 2021-01-10 (×2): 5 [IU] via SUBCUTANEOUS
  Administered 2021-01-11 (×3): 3 [IU] via SUBCUTANEOUS
  Administered 2021-01-12: 5 [IU] via SUBCUTANEOUS
  Administered 2021-01-12 (×2): 3 [IU] via SUBCUTANEOUS
  Administered 2021-01-13: 2 [IU] via SUBCUTANEOUS
  Administered 2021-01-13: 3 [IU] via SUBCUTANEOUS

## 2021-01-08 NOTE — Progress Notes (Signed)
PROGRESS NOTE    [name] Berg  WUJ:[id number] DOB: 04/20/1966 DOA: 01/07/2021 PCP: Irena Reichmann, DO   Brief Narrative:  55 year old with history of obesity, HTN, chronic A. fib on Xarelto admitted to the hospital for worsening dyspnea, cough and wheezing.  He is not vaccinated against COVID-19, he was tested positive on 12/31/2020.  Initially hypoxic down to 85% on room air requiring 2 L nasal cannula which escalated to 6 L nasal cannula.  Chest x-ray showed bilateral multiple infiltrates.   Assessment & Plan:   Active Problems:   Acute hypoxemic respiratory failure due to COVID-19 [facility])  Acute hypoxic respiratory failure secondary to COVID-19 pneumonia -Oxygen levels- 6 L nasal cannula -Remdesivir- day 2 -Solu-Medrol every 12 hours-day 2 -Baricitinib-day 1 -Antibiotics- none -procalcitonin- negative -Chest x-ray-Bilateral multifocal infiltrates -Supportive care-antitussive, inhalers, I-S/flutter -CODE STATUS confirmed -Vitamin C & Zinc. Prone >16hrs/day.  -Routine: Labs have been reviewed including ferritin, LDH, CRP, d-dimer, fibrinogen.  Will need to trend this lab daily.  Hypokalemia - Resolved  Chronic atrial fibrillation - On Xarelto and Lopressor  Essential hypertension - Currently on Lopressor     DVT prophylaxis: Xarelto Code Status: Full  Family Communication:  Called Cathie, no answer. Left vm  Status is: Inpatient  Remains inpatient appropriate because:IV treatments appropriate due to intensity of illness or inability to take PO   Dispo: The patient is from: Home              Anticipated d/c is to: Home              Anticipated d/c date is: > 3 days              Patient currently is not medically stable to d/c.  Maintain hospital stay for significant hypoxia requiring 6 L nasal cannula       Body mass index is 57.52 kg/m.       Subjective: Early this morning patient remained hypoxic on 2 L nasal cannula therefore had to be increased to 6 L  with possibly transitioning to high flow.  Patient sitting up in a chair overall feels okay but does get dyspneic with minimal exertion.  Denies any other complaints.  Review of Systems Otherwise negative except as per HPI, including: General: Denies fever, chills, night sweats or unintended weight loss. Resp: Denies wheezing Cardiac: Denies chest pain, palpitations, orthopnea, paroxysmal nocturnal dyspnea. GI: Denies abdominal pain, nausea, vomiting, diarrhea or constipation GU: Denies dysuria, frequency, hesitancy or incontinence MS: Denies muscle aches, joint pain or swelling Neuro: Denies headache, neurologic deficits (focal weakness, numbness, tingling), abnormal gait Psych: Denies anxiety, depression, SI/HI/AVH Skin: Denies new rashes or lesions ID: Denies sick contacts, exotic exposures, travel  Examination:  General exam: Appears calm and comfortable, morbid obesity Respiratory system: Bilateral rhonchi Cardiovascular system: S1 & S2 heard, RRR. No JVD, murmurs, rubs, gallops or clicks. No pedal edema. Gastrointestinal system: Abdomen is nondistended, soft and nontender. No organomegaly or masses felt. Normal bowel sounds heard. Central nervous system: Alert and oriented. No focal neurological deficits. Extremities: Symmetric 5 x 5 power. Skin: No rashes, lesions or ulcers Psychiatry: Judgement and insight appear normal. Mood & affect appropriate.     Objective: Vitals:   01/08/21 1200 01/08/21 1216 01/08/21 1216 01/08/21 1304  BP: 116/73   (!) 141/97  Pulse: 82   80  Resp: (!) 24   17  Temp:  97.8 F (36.6 C) 97.8 F (36.6 C) 97.9 F (36.6 C)  TempSrc:  Oral Oral Oral  SpO2: (!) 87%   91%  Weight:      Height:       No intake or output data in the 24 hours ending 01/08/21 1412 Filed Weights   01/07/21 1554  Weight: (!) 203.2 kg     Data Reviewed:   CBC: Recent Labs  Lab 01/07/21 1851 01/08/21 1042  WBC 7.9 5.9  NEUTROABS 5.7 5.0  HGB 15.8 15.7  HCT  47.6 49.0  MCV 96.4 98.8  PLT 91* PLATELET CLUMPS NOTED ON SMEAR, UNABLE TO ESTIMATE   Basic Metabolic Panel: Recent Labs  Lab 01/07/21 1851 01/08/21 1042  NA 135 134*  K 3.4* 4.4  CL 96* 98  CO2 26 23  GLUCOSE 135* 312*  BUN 7 10  CREATININE 0.80 0.82  CALCIUM 8.0* 8.0*   GFR: Estimated Creatinine Clearance: 188 mL/min (by C-G formula based on SCr of 0.82 mg/dL). Liver Function Tests: Recent Labs  Lab 01/07/21 1851 01/08/21 1042  AST 45* 42*  ALT 37 36  ALKPHOS 50 48  BILITOT 1.3* 1.5*  PROT 6.7 6.6  ALBUMIN 2.5* 2.4*   No results for input(s): LIPASE, AMYLASE in the last 168 hours. No results for input(s): AMMONIA in the last 168 hours. Coagulation Profile: No results for input(s): INR, PROTIME in the last 168 hours. Cardiac Enzymes: No results for input(s): CKTOTAL, CKMB, CKMBINDEX, TROPONINI in the last 168 hours. BNP (last 3 results) No results for input(s): PROBNP in the last 8760 hours. HbA1C: No results for input(s): HGBA1C in the last 72 hours. CBG: No results for input(s): GLUCAP in the last 168 hours. Lipid Profile: Recent Labs    01/07/21 1851  TRIG 75   Thyroid Function Tests: No results for input(s): TSH, T4TOTAL, FREET4, T3FREE, THYROIDAB in the last 72 hours. Anemia Panel: Recent Labs    01/07/21 2034 01/08/21 1042  FERRITIN 801* 767*   Sepsis Labs: Recent Labs  Lab 01/07/21 1851 01/07/21 2034 01/07/21 2103  PROCALCITON <0.10 <0.10  --   LATICACIDVEN 2.2*  --  2.1*    No results found for this or any previous visit (from the past 240 hour(s)).       Radiology Studies: DG Chest Portable 1 View  Result Date: 01/07/2021 CLINICAL DATA:  COVID positive with shortness of breath. EXAM: PORTABLE CHEST 1 VIEW COMPARISON:  December 26, 2014 FINDINGS: Marked severity multifocal infiltrates are seen throughout both lungs. There is no evidence of a pleural effusion or pneumothorax. The heart size and mediastinal contours are within normal  limits. The visualized skeletal structures are unremarkable. IMPRESSION: Marked severity bilateral multifocal infiltrates. Electronically Signed   By: Aram Candela M.D.   On: 01/07/2021 16:19        Scheduled Meds: . vitamin C  500 mg Oral Daily  . baricitinib  4 mg Oral Daily  . cholecalciferol  1,000 Units Oral Daily  . famotidine  20 mg Oral BID  . guaiFENesin  600 mg Oral BID  . metoprolol tartrate  100 mg Oral BID  . [START ON 01/10/2021] predniSONE  50 mg Oral Daily  . rivaroxaban  20 mg Oral Daily  . zinc sulfate  220 mg Oral Daily   Continuous Infusions: . sodium chloride 100 mL/hr at 01/08/21 1243  . methylPREDNISolone (SOLU-MEDROL) injection 200 mg (01/08/21 1056)  . remdesivir 100 mg in NS 100 mL 100 mg (01/08/21 1102)     LOS: 1 day   Time spent= 35 mins    Korissa Horsford Joline Maxcy, MD  [facility]  If 7PM-7AM, please contact night-coverage  01/08/2021, 2:12 PM

## 2021-01-08 NOTE — Progress Notes (Signed)
CRITICAL VALUE ALERT  Critical Value:  MRSA PCR positive  Date & Time Notied:  01/09/2020; 16:25  Provider Notified: Dr. Nelson Chimes  Orders Received/Actions taken: starts the protocol

## 2021-01-08 NOTE — Progress Notes (Signed)
Noted that patient's lab blood sugar today was 312 mg/dl. No history of diabetes noted in chart.  Recommend checking HgbA1C for home blood glucose management. Recommend starting patient on Novolog MODERATE correction scale TID and HS scale while in hospital and on steroids. Will need to check blood sugars TID & HS.  Smith Mince RN BSN CDE Diabetes Coordinator Pager: [phone]  8am-5pm

## 2021-01-08 NOTE — ED Notes (Signed)
Breakfast Ordered 

## 2021-01-08 NOTE — ED Notes (Signed)
Paged night coverage about patient O2. Pt reports no SOB

## 2021-01-08 NOTE — ED Notes (Signed)
Lunch Tray Ordered @ 1046. 

## 2021-01-08 NOTE — ED Notes (Signed)
Pt with sats of 84% on 5L.  O2 increased to 6L, pt placed in recliner and is using the incentive spirometer.  Tolerating well.  Sats increased to 90%.

## 2021-01-08 NOTE — ED Notes (Signed)
Attempted report 

## 2021-01-08 NOTE — Progress Notes (Signed)
Patient trasfered from ED to [phone] via stretcher; alert and oriented x 4; no complaints of pain; IV saline locked in LFA and fluids running in right wrist; skin - MASD under abdominal folds and on thighs. Patient on oxygen requires 7 L HFNC with sat O2 94%. Orient patient to room and unit; gave patient care guide; instructed how to use the call bell and  fall risk precautions. Tele monitor placed per order. Will continue to monitor the patient.

## 2021-01-08 NOTE — ED Notes (Signed)
Dr. Nelson Chimes informed via phone call about pt O2 levels in the high 80s low 90s and pt being tachypneic. Dr. Nelson Chimes responded that pt could be brought up to 5-6L [place]. This RN relayed this information during handoff to the next shift.

## 2021-01-09 LAB — CBC WITH DIFFERENTIAL/PLATELET
Abs Immature Granulocytes: 0.1 10*3/uL — ABNORMAL HIGH (ref 0.00–0.07)
Basophils Absolute: 0 10*3/uL (ref 0.0–0.1)
Basophils Relative: 0 %
Eosinophils Absolute: 0 10*3/uL (ref 0.0–0.5)
Eosinophils Relative: 0 %
HCT: 49.2 % (ref 39.0–52.0)
Hemoglobin: 15.4 g/dL (ref 13.0–17.0)
Immature Granulocytes: 1 %
Lymphocytes Relative: 9 %
Lymphs Abs: 1 10*3/uL (ref 0.7–4.0)
MCH: 31 pg (ref 26.0–34.0)
MCHC: 31.3 g/dL (ref 30.0–36.0)
MCV: 99.2 fL (ref 80.0–100.0)
Monocytes Absolute: 0.5 10*3/uL (ref 0.1–1.0)
Monocytes Relative: 5 %
Neutro Abs: 9.7 10*3/uL — ABNORMAL HIGH (ref 1.7–7.7)
Neutrophils Relative %: 85 %
Platelets: 142 10*3/uL — ABNORMAL LOW (ref 150–400)
RBC: 4.96 MIL/uL (ref 4.22–5.81)
RDW: 13.1 % (ref 11.5–15.5)
WBC: 11.4 10*3/uL — ABNORMAL HIGH (ref 4.0–10.5)
nRBC: 0 % (ref 0.0–0.2)

## 2021-01-09 LAB — COMPREHENSIVE METABOLIC PANEL
ALT: 35 U/L (ref 0–44)
AST: 31 U/L (ref 15–41)
Albumin: 2.3 g/dL — ABNORMAL LOW (ref 3.5–5.0)
Alkaline Phosphatase: 49 U/L (ref 38–126)
Anion gap: 10 (ref 5–15)
BUN: 17 mg/dL (ref 6–20)
CO2: 27 mmol/L (ref 22–32)
Calcium: 8.6 mg/dL — ABNORMAL LOW (ref 8.9–10.3)
Chloride: 101 mmol/L (ref 98–111)
Creatinine, Ser: 0.99 mg/dL (ref 0.61–1.24)
GFR, Estimated: >60 mL/min (ref >60)
Glucose, Bld: 311 mg/dL — ABNORMAL HIGH (ref 70–99)
Potassium: 4.7 mmol/L (ref 3.5–5.1)
Sodium: 138 mmol/L (ref 135–145)
Total Bilirubin: 0.7 mg/dL (ref 0.3–1.2)
Total Protein: 6.7 g/dL (ref 6.5–8.1)

## 2021-01-09 LAB — BRAIN NATRIURETIC PEPTIDE: B Natriuretic Peptide: 145.7 pg/mL — ABNORMAL HIGH (ref 0.0–100.0)

## 2021-01-09 LAB — FERRITIN: Ferritin: 736 ng/mL — ABNORMAL HIGH (ref 24–336)

## 2021-01-09 LAB — GLUCOSE, CAPILLARY
Glucose-Capillary: 271 mg/dL — ABNORMAL HIGH (ref 70–99)
Glucose-Capillary: 303 mg/dL — ABNORMAL HIGH (ref 70–99)
Glucose-Capillary: 239 mg/dL — ABNORMAL HIGH (ref 70–99)
Glucose-Capillary: 244 mg/dL — ABNORMAL HIGH (ref 70–99)

## 2021-01-09 LAB — D-DIMER, QUANTITATIVE: D-Dimer, Quant: 1.56 ug/mL-FEU — ABNORMAL HIGH (ref 0.00–0.50)

## 2021-01-09 LAB — C-REACTIVE PROTEIN: CRP: 9.8 mg/dL — ABNORMAL HIGH (ref <1.0)

## 2021-01-09 MED ORDER — METHYLPREDNISOLONE SODIUM SUCC 40 MG IJ SOLR
40.0000 mg | Freq: Two times a day (BID) | INTRAMUSCULAR | Status: DC
  Administered 2021-01-09 – 2021-01-12 (×7): 40 mg via INTRAVENOUS
  Filled 2021-01-09 (×7): qty 1

## 2021-01-09 NOTE — Progress Notes (Signed)
PROGRESS NOTE    [name] Berg  UPJ:[id number] DOB: May 09, 1966 DOA: 01/07/2021 PCP: Irena Reichmann, DO   Brief Narrative:  55 year old with history of obesity, HTN, chronic A. fib on Xarelto admitted to the hospital for worsening dyspnea, cough and wheezing.  He is not vaccinated against COVID-19, he was tested positive on 12/31/2020.  Initially hypoxic down to 85% on room air requiring 2 L nasal cannula which escalated to 6 L nasal cannula.  Chest x-ray showed bilateral multiple infiltrates. He was started on Solu-Medrol, remdesivir and baricitinib.   Assessment & Plan:   Active Problems:   Acute hypoxemic respiratory failure due to COVID-19 [facility])  Acute hypoxic respiratory failure secondary to COVID-19 pneumonia -Oxygen levels- 6 L nasal cannula -Remdesivir- day 3 -Solumedrol 40mg  q12hrs-day 3 -Baricitinib-day 2 -Antibiotics- none -procalcitonin- negative -Chest x-ray-Bilateral multifocal infiltrates -Supportive care-antitussive, inhalers, I-S/flutter -CODE STATUS confirmed -Vitamin C & Zinc. Prone >16hrs/day.  -Routine: Labs have been reviewed including ferritin, LDH, CRP, d-dimer, fibrinogen.  Will need to trend this lab daily.  Hypokalemia - Resolved  Chronic atrial fibrillation - On Xarelto and Lopressor  Essential hypertension - Currently on Lopressor  Hyperglycemia secondary to steroid use - On sliding scale and Accu-Cheks   DVT prophylaxis: Xarelto Code Status: Full  Family Communication:  Called Cathie, no answer. Left vm  Status is: Inpatient  Remains inpatient appropriate because:IV treatments appropriate due to intensity of illness or inability to take PO   Dispo: The patient is from: Home              Anticipated d/c is to: Home              Anticipated d/c date is: > 3 days              Patient currently is not medically stable to d/c.  Maintain hospital stay for significant hypoxia requiring High Flow of 9L     Body mass index is 57.52 kg/m.        Subjective: Sitting up in the chair feels okay.  Requiring 9 L of high flow.  Using incentive spirometer and flutter valve without any issues.  Feels slightly better.  Review of Systems Otherwise negative except as per HPI, including: General: Denies fever, chills, night sweats or unintended weight loss. Resp: Denies hemoptysis Cardiac: Denies chest pain, palpitations, orthopnea, paroxysmal nocturnal dyspnea. GI: Denies abdominal pain, nausea, vomiting, diarrhea or constipation GU: Denies dysuria, frequency, hesitancy or incontinence MS: Denies muscle aches, joint pain or swelling Neuro: Denies headache, neurologic deficits (focal weakness, numbness, tingling), abnormal gait Psych: Denies anxiety, depression, SI/HI/AVH Skin: Denies new rashes or lesions ID: Denies sick contacts, exotic exposures, travel  Examination:   Constitutional: Not in acute distress.  Morbid obesity, 9 L high flow Respiratory: Bilateral diffuse diminished breath sounds Cardiovascular: Normal sinus rhythm, no rubs Abdomen: Nontender nondistended good bowel sounds Musculoskeletal: No edema noted Skin: No rashes seen Neurologic: CN 2-12 grossly intact.  And nonfocal Psychiatric: Normal judgment and insight. Alert and oriented x 3. Normal mood.  Objective: Vitals:   01/09/21 0004 01/09/21 0135 01/09/21 0139 01/09/21 0423  BP: (!) 127/91   (!) 134/99  Pulse: 83   96  Resp: 20   20  Temp: 98.3 F (36.8 C)   97.8 F (36.6 C)  TempSrc: Oral   Oral  SpO2: 93% (!) 87% 90% 94%  Weight:      Height:        Intake/Output Summary (Last 24 hours) at 01/09/2021  6962 Last data filed at 01/08/2021 2300 Gross per 24 hour  Intake 1230.27 ml  Output -  Net 1230.27 ml   Filed Weights   01/07/21 1554  Weight: (!) 203.2 kg     Data Reviewed:   CBC: Recent Labs  Lab 01/07/21 1851 01/08/21 1042 01/09/21 0524  WBC 7.9 5.9 11.4*  NEUTROABS 5.7 5.0 9.7*  HGB 15.8 15.7 15.4  HCT 47.6 49.0 49.2  MCV  96.4 98.8 99.2  PLT 91* PLATELET CLUMPS NOTED ON SMEAR, UNABLE TO ESTIMATE 142*   Basic Metabolic Panel: Recent Labs  Lab 01/07/21 1851 01/08/21 1042 01/09/21 0524  NA 135 134* 138  K 3.4* 4.4 4.7  CL 96* 98 101  CO2 26 23 27   GLUCOSE 135* 312* 311*  BUN 7 10 17   CREATININE 0.80 0.82 0.99  CALCIUM 8.0* 8.0* 8.6*   GFR: Estimated Creatinine Clearance: 155.7 mL/min (by C-G formula based on SCr of 0.99 mg/dL). Liver Function Tests: Recent Labs  Lab 01/07/21 1851 01/08/21 1042 01/09/21 0524  AST 45* 42* 31  ALT 37 36 35  ALKPHOS 50 48 49  BILITOT 1.3* 1.5* 0.7  PROT 6.7 6.6 6.7  ALBUMIN 2.5* 2.4* 2.3*   No results for input(s): LIPASE, AMYLASE in the last 168 hours. No results for input(s): AMMONIA in the last 168 hours. Coagulation Profile: No results for input(s): INR, PROTIME in the last 168 hours. Cardiac Enzymes: No results for input(s): CKTOTAL, CKMB, CKMBINDEX, TROPONINI in the last 168 hours. BNP (last 3 results) No results for input(s): PROBNP in the last 8760 hours. HbA1C: Recent Labs    01/08/21 1042  HGBA1C 6.6*   CBG: Recent Labs  Lab 01/08/21 1642 01/08/21 2103  GLUCAP 309* 284*   Lipid Profile: Recent Labs    01/07/21 1851  TRIG 75   Thyroid Function Tests: No results for input(s): TSH, T4TOTAL, FREET4, T3FREE, THYROIDAB in the last 72 hours. Anemia Panel: Recent Labs    01/08/21 1042 01/09/21 0524  FERRITIN 767* 736*   Sepsis Labs: Recent Labs  Lab 01/07/21 1851 01/07/21 2034 01/07/21 2103  PROCALCITON <0.10 <0.10  --   LATICACIDVEN 2.2*  --  2.1*    Recent Results (from the past 240 hour(s))  MRSA PCR Screening     Status: Abnormal   Collection Time: 01/08/21  2:41 PM   Specimen: Nasopharyngeal  Result Value Ref Range Status   MRSA by PCR POSITIVE (A) NEGATIVE Final    Comment:        The GeneXpert MRSA Assay (FDA approved for NASAL specimens only), is one component of a comprehensive MRSA colonization surveillance  program. It is not intended to diagnose MRSA infection nor to guide or monitor treatment for MRSA infections. RESULT CALLED TO, READ BACK BY AND VERIFIED [email or web address] RN 01/10/21 01/08/21 A BROWNING Performed at [facility], [address], [place], [address] [place]          Radiology Studies: DG Chest Portable 1 View  Result Date: 01/07/2021 CLINICAL DATA:  COVID positive with shortness of breath. EXAM: PORTABLE CHEST 1 VIEW COMPARISON:  December 26, 2014 FINDINGS: Marked severity multifocal infiltrates are seen throughout both lungs. There is no evidence of a pleural effusion or pneumothorax. The heart size and mediastinal contours are within normal limits. The visualized skeletal structures are unremarkable. IMPRESSION: Marked severity bilateral multifocal infiltrates. Electronically Signed   By: 01/09/2021 M.D.   On: 01/07/2021 16:19  Scheduled Meds: . vitamin C  500 mg Oral Daily  . baricitinib  4 mg Oral Daily  . Chlorhexidine Gluconate Cloth  6 each Topical Q0600  . cholecalciferol  1,000 Units Oral Daily  . famotidine  20 mg Oral BID  . guaiFENesin  600 mg Oral BID  . insulin aspart  0-15 Units Subcutaneous TID WC  . insulin aspart  0-5 Units Subcutaneous QHS  . metoprolol tartrate  100 mg Oral BID  . mupirocin ointment  1 application Nasal BID  . [START ON 01/10/2021] predniSONE  50 mg Oral Daily  . rivaroxaban  20 mg Oral Daily  . zinc sulfate  220 mg Oral Daily   Continuous Infusions: . sodium chloride 100 mL/hr at 01/08/21 2348  . methylPREDNISolone (SOLU-MEDROL) injection Stopped (01/08/21 2215)  . remdesivir 100 mg in NS 100 mL Stopped (01/08/21 2022)     LOS: 2 days   Time spent= 35 mins    Ankit Joline Maxcy, MD [facility]  If 7PM-7AM, please contact night-coverage  01/09/2021, 7:57 AM

## 2021-01-10 LAB — COMPREHENSIVE METABOLIC PANEL
ALT: 36 U/L (ref 0–44)
AST: 33 U/L (ref 15–41)
Albumin: 2.3 g/dL — ABNORMAL LOW (ref 3.5–5.0)
Alkaline Phosphatase: 51 U/L (ref 38–126)
Anion gap: 11 (ref 5–15)
BUN: 22 mg/dL — ABNORMAL HIGH (ref 6–20)
CO2: 23 mmol/L (ref 22–32)
Calcium: 8.6 mg/dL — ABNORMAL LOW (ref 8.9–10.3)
Chloride: 104 mmol/L (ref 98–111)
Creatinine, Ser: 0.98 mg/dL (ref 0.61–1.24)
GFR, Estimated: >60 mL/min (ref >60)
Glucose, Bld: 221 mg/dL — ABNORMAL HIGH (ref 70–99)
Potassium: 4.7 mmol/L (ref 3.5–5.1)
Sodium: 138 mmol/L (ref 135–145)
Total Bilirubin: 0.7 mg/dL (ref 0.3–1.2)
Total Protein: 6.6 g/dL (ref 6.5–8.1)

## 2021-01-10 LAB — CBC WITH DIFFERENTIAL/PLATELET
Abs Immature Granulocytes: 0.15 10*3/uL — ABNORMAL HIGH (ref 0.00–0.07)
Basophils Absolute: 0 10*3/uL (ref 0.0–0.1)
Basophils Relative: 0 %
Eosinophils Absolute: 0 10*3/uL (ref 0.0–0.5)
Eosinophils Relative: 0 %
HCT: 46.1 % (ref 39.0–52.0)
Hemoglobin: 15 g/dL (ref 13.0–17.0)
Immature Granulocytes: 1 %
Lymphocytes Relative: 10 %
Lymphs Abs: 1.1 10*3/uL (ref 0.7–4.0)
MCH: 31.6 pg (ref 26.0–34.0)
MCHC: 32.5 g/dL (ref 30.0–36.0)
MCV: 97.3 fL (ref 80.0–100.0)
Monocytes Absolute: 0.9 10*3/uL (ref 0.1–1.0)
Monocytes Relative: 8 %
Neutro Abs: 9.4 10*3/uL — ABNORMAL HIGH (ref 1.7–7.7)
Neutrophils Relative %: 81 %
Platelets: 192 10*3/uL (ref 150–400)
RBC: 4.74 MIL/uL (ref 4.22–5.81)
RDW: 13.1 % (ref 11.5–15.5)
WBC: 11.5 10*3/uL — ABNORMAL HIGH (ref 4.0–10.5)
nRBC: 0 % (ref 0.0–0.2)

## 2021-01-10 LAB — GLUCOSE, CAPILLARY
Glucose-Capillary: 200 mg/dL — ABNORMAL HIGH (ref 70–99)
Glucose-Capillary: 230 mg/dL — ABNORMAL HIGH (ref 70–99)
Glucose-Capillary: 240 mg/dL — ABNORMAL HIGH (ref 70–99)
Glucose-Capillary: 196 mg/dL — ABNORMAL HIGH (ref 70–99)

## 2021-01-10 LAB — FERRITIN: Ferritin: 676 ng/mL — ABNORMAL HIGH (ref 24–336)

## 2021-01-10 LAB — C-REACTIVE PROTEIN: CRP: 5.5 mg/dL — ABNORMAL HIGH (ref <1.0)

## 2021-01-10 LAB — D-DIMER, QUANTITATIVE: D-Dimer, Quant: 1.27 ug/mL-FEU — ABNORMAL HIGH (ref 0.00–0.50)

## 2021-01-10 MED ORDER — INSULIN DETEMIR 100 UNIT/ML ~~LOC~~ SOLN
7.0000 [IU] | Freq: Every day | SUBCUTANEOUS | Status: DC
  Administered 2021-01-10 – 2021-01-13 (×4): 7 [IU] via SUBCUTANEOUS
  Filled 2021-01-10 (×4): qty 0.07

## 2021-01-10 NOTE — Progress Notes (Signed)
PROGRESS NOTE    [name] Berg  FAO:[id number] DOB: 07-30-1966 DOA: 01/07/2021 PCP: Irena Reichmann, DO   Brief Narrative:  55 year old with history of obesity, HTN, chronic A. fib on Xarelto admitted to the hospital for worsening dyspnea, cough and wheezing.  He is not vaccinated against COVID-19, he was tested positive on 12/31/2020.  Initially hypoxic down to 85% on room air requiring 2 L nasal cannula which escalated to 6 L nasal cannula.  Chest x-ray showed bilateral multiple infiltrates. He was started on Solu-Medrol, remdesivir and baricitinib.   Assessment & Plan:   Active Problems:   Acute hypoxemic respiratory failure due to COVID-19 [facility])  Acute hypoxic respiratory failure secondary to COVID-19 pneumonia -Oxygen levels-5 L high Flow.  -Remdesivir- day 4 -Solumedrol 40mg  q12hrs-day 4 -Baricitinib-day 3 -Antibiotics- none -procalcitonin- negative -Chest x-ray-Bilateral multifocal infiltrates -Supportive care-antitussive, inhalers, I-S/flutter -CODE STATUS confirmed -Vitamin C & Zinc. Prone >16hrs/day.  -Routine: Labs have been reviewed including ferritin, LDH, CRP, d-dimer, fibrinogen.  Will need to trend this lab daily.  Hypokalemia - Resolved  Chronic atrial fibrillation - On Xarelto and Lopressor  Essential hypertension - Currently on Lopressor  Hyperglycemia secondary to steroid use; BS >200 - On sliding scale and Accu-Cheks. Added levemir 7U daily.    DVT prophylaxis: Xarelto Code Status: Full  Family Communication:  Called Cathie, no answer 1/23  Status is: Inpatient  Remains inpatient appropriate because:IV treatments appropriate due to intensity of illness or inability to take PO   Dispo: The patient is from: Home              Anticipated d/c is to: Home              Anticipated d/c date is: > 3 days              Patient currently is not medically stable to d/c.  Maintain hospital stay for significant hypoxia requiring High Flow of 5L   Body mass  index is 57.52 kg/m.    Subjective: Feels better, no new complaints.   Review of Systems Otherwise negative except as per HPI, including: General = no fevers, chills, dizziness,  fatigue HEENT/EYES = negative for loss of vision, double vision, blurred vision,  sore throa Cardiovascular= negative for chest pain, palpitation Respiratory/lungs= negative for shortness of breath, cough, wheezing; hemoptysis,  Gastrointestinal= negative for nausea, vomiting, abdominal pain Genitourinary= negative for Dysuria MSK = Negative for arthralgia, myalgias Neurology= Negative for headache, numbness, tingling  Psychiatry= Negative for suicidal and homocidal ideation Skin= Negative for Rash   Examination: Constitutional: Not in acute distress. Obese, 6L HFNC Respiratory: b/l rhonchi.  Cardiovascular: Normal sinus rhythm, no rubs Abdomen: Nontender nondistended good bowel sounds Musculoskeletal: No edema noted Skin: No rashes seen Neurologic: CN 2-12 grossly intact.  And nonfocal Psychiatric: Normal judgment and insight. Alert and oriented x 3. Normal mood.      Objective: Vitals:   01/09/21 1741 01/09/21 2034 01/10/21 0015 01/10/21 0441  BP:  (!) 143/96 (!) 152/109 (!) 141/90  Pulse:  (!) 106 97 86  Resp:  20 20 20   Temp:  98.2 F (36.8 C) 98.1 F (36.7 C) 97.9 F (36.6 C)  TempSrc:  Oral Oral Oral  SpO2: 99% 93% 90% 91%  Weight:      Height:        Intake/Output Summary (Last 24 hours) at 01/10/2021 0805 Last data filed at 01/09/2021 2000 Gross per 24 hour  Intake 3652.91 ml  Output -  Net 3652.91 ml  Filed Weights   01/07/21 1554  Weight: (!) 203.2 kg     Data Reviewed:   CBC: Recent Labs  Lab 01/07/21 1851 01/08/21 1042 01/09/21 0524 01/10/21 0221  WBC 7.9 5.9 11.4* 11.5*  NEUTROABS 5.7 5.0 9.7* 9.4*  HGB 15.8 15.7 15.4 15.0  HCT 47.6 49.0 49.2 46.1  MCV 96.4 98.8 99.2 97.3  PLT 91* PLATELET CLUMPS NOTED ON SMEAR, UNABLE TO ESTIMATE 142* 192   Basic  Metabolic Panel: Recent Labs  Lab 01/07/21 1851 01/08/21 1042 01/09/21 0524 01/10/21 0221  NA 135 134* 138 138  K 3.4* 4.4 4.7 4.7  CL 96* 98 101 104  CO2 26 23 27 23   GLUCOSE 135* 312* 311* 221*  BUN 7 10 17  22*  CREATININE 0.80 0.82 0.99 0.98  CALCIUM 8.0* 8.0* 8.6* 8.6*   GFR: Estimated Creatinine Clearance: 157.3 mL/min (by C-G formula based on SCr of 0.98 mg/dL). Liver Function Tests: Recent Labs  Lab 01/07/21 1851 01/08/21 1042 01/09/21 0524 01/10/21 0221  AST 45* 42* 31 33  ALT 37 36 35 36  ALKPHOS 50 48 49 51  BILITOT 1.3* 1.5* 0.7 0.7  PROT 6.7 6.6 6.7 6.6  ALBUMIN 2.5* 2.4* 2.3* 2.3*   No results for input(s): LIPASE, AMYLASE in the last 168 hours. No results for input(s): AMMONIA in the last 168 hours. Coagulation Profile: No results for input(s): INR, PROTIME in the last 168 hours. Cardiac Enzymes: No results for input(s): CKTOTAL, CKMB, CKMBINDEX, TROPONINI in the last 168 hours. BNP (last 3 results) No results for input(s): PROBNP in the last 8760 hours. HbA1C: Recent Labs    01/08/21 1042  HGBA1C 6.6*   CBG: Recent Labs  Lab 01/08/21 2103 01/09/21 0816 01/09/21 1138 01/09/21 1632 01/09/21 2143  GLUCAP 284* 271* 303* 239* 244*   Lipid Profile: Recent Labs    01/07/21 1851  TRIG 75   Thyroid Function Tests: No results for input(s): TSH, T4TOTAL, FREET4, T3FREE, THYROIDAB in the last 72 hours. Anemia Panel: Recent Labs    01/09/21 0524 01/10/21 0221  FERRITIN 736* 676*   Sepsis Labs: Recent Labs  Lab 01/07/21 1851 01/07/21 2034 01/07/21 2103  PROCALCITON <0.10 <0.10  --   LATICACIDVEN 2.2*  --  2.1*    Recent Results (from the past 240 hour(s))  Blood Culture (routine x 2)     Status: None (Preliminary result)   Collection Time: 01/07/21  7:00 PM   Specimen: BLOOD RIGHT HAND  Result Value Ref Range Status   Specimen Description BLOOD RIGHT HAND  Final   Special Requests   Final    BOTTLES DRAWN AEROBIC AND ANAEROBIC  Blood Culture adequate volume   Culture   Final    NO GROWTH 1 DAY Performed at [facility], [address] [place], [address] [place]    Report Status PENDING  Incomplete  Blood Culture (routine x 2)     Status: None (Preliminary result)   Collection Time: 01/07/21  7:13 PM   Specimen: BLOOD LEFT FOREARM  Result Value Ref Range Status   Specimen Description BLOOD LEFT FOREARM  Final   Special Requests   Final    BOTTLES DRAWN AEROBIC AND ANAEROBIC Blood Culture adequate volume   Culture   Final    NO GROWTH 1 DAY Performed at [facility], [address] [place], [address] [place]    Report Status PENDING  Incomplete  MRSA PCR Screening     Status: Abnormal  Collection Time: 01/08/21  2:41 PM   Specimen: Nasopharyngeal  Result Value Ref Range Status   MRSA by PCR POSITIVE (A) NEGATIVE Final    Comment:        The GeneXpert MRSA Assay (FDA approved for NASAL specimens only), is one component of a comprehensive MRSA colonization surveillance program. It is not intended to diagnose MRSA infection nor to guide or monitor treatment for MRSA infections. RESULT CALLED TO, READ BACK BY AND VERIFIED WITHSilverio Decamp RN 1610 01/08/21 A BROWNING Performed at [facility], [address] [place], [place] [address]          Radiology Studies: No results found.      Scheduled Meds: . vitamin C  500 mg Oral Daily  . baricitinib  4 mg Oral Daily  . Chlorhexidine Gluconate Cloth  6 each Topical Q0600  . cholecalciferol  1,000 Units Oral Daily  . famotidine  20 mg Oral BID  . guaiFENesin  600 mg Oral BID  . insulin aspart  0-15 Units Subcutaneous TID WC  . insulin aspart  0-5 Units Subcutaneous QHS  . methylPREDNISolone (SOLU-MEDROL) injection  40 mg Intravenous Q12H  . metoprolol tartrate  100 mg Oral BID  . mupirocin ointment  1 application Nasal BID  . rivaroxaban  20 mg Oral Daily  . zinc sulfate  220 mg Oral Daily   Continuous Infusions: .  sodium chloride 100 mL/hr at 01/09/21 0837  . remdesivir 100 mg in NS 100 mL 100 mg (01/09/21 0844)     LOS: 3 days   Time spent= 35 mins    Marielis Samara Joline Maxcy, MD [facility]  If 7PM-7AM, please contact night-coverage  01/10/2021, 8:05 AM

## 2021-01-11 LAB — CBC WITH DIFFERENTIAL/PLATELET
Abs Immature Granulocytes: 0.13 10*3/uL — ABNORMAL HIGH (ref 0.00–0.07)
Basophils Absolute: 0 10*3/uL (ref 0.0–0.1)
Basophils Relative: 0 %
Eosinophils Absolute: 0 10*3/uL (ref 0.0–0.5)
Eosinophils Relative: 0 %
HCT: 48.3 % (ref 39.0–52.0)
Hemoglobin: 15.1 g/dL (ref 13.0–17.0)
Immature Granulocytes: 1 %
Lymphocytes Relative: 8 %
Lymphs Abs: 1 10*3/uL (ref 0.7–4.0)
MCH: 30.8 pg (ref 26.0–34.0)
MCHC: 31.3 g/dL (ref 30.0–36.0)
MCV: 98.4 fL (ref 80.0–100.0)
Monocytes Absolute: 1 10*3/uL (ref 0.1–1.0)
Monocytes Relative: 8 %
Neutro Abs: 9.4 10*3/uL — ABNORMAL HIGH (ref 1.7–7.7)
Neutrophils Relative %: 83 %
Platelets: 158 10*3/uL (ref 150–400)
RBC: 4.91 MIL/uL (ref 4.22–5.81)
RDW: 13 % (ref 11.5–15.5)
WBC: 11.4 10*3/uL — ABNORMAL HIGH (ref 4.0–10.5)
nRBC: 0 % (ref 0.0–0.2)

## 2021-01-11 LAB — COMPREHENSIVE METABOLIC PANEL
ALT: 44 U/L (ref 0–44)
AST: 42 U/L — ABNORMAL HIGH (ref 15–41)
Albumin: 2.4 g/dL — ABNORMAL LOW (ref 3.5–5.0)
Alkaline Phosphatase: 47 U/L (ref 38–126)
Anion gap: 9 (ref 5–15)
BUN: 22 mg/dL — ABNORMAL HIGH (ref 6–20)
CO2: 25 mmol/L (ref 22–32)
Calcium: 8.8 mg/dL — ABNORMAL LOW (ref 8.9–10.3)
Chloride: 105 mmol/L (ref 98–111)
Creatinine, Ser: 0.94 mg/dL (ref 0.61–1.24)
GFR, Estimated: >60 mL/min (ref >60)
Glucose, Bld: 229 mg/dL — ABNORMAL HIGH (ref 70–99)
Potassium: 5.2 mmol/L — ABNORMAL HIGH (ref 3.5–5.1)
Sodium: 139 mmol/L (ref 135–145)
Total Bilirubin: 0.8 mg/dL (ref 0.3–1.2)
Total Protein: 6.5 g/dL (ref 6.5–8.1)

## 2021-01-11 LAB — GLUCOSE, CAPILLARY
Glucose-Capillary: 187 mg/dL — ABNORMAL HIGH (ref 70–99)
Glucose-Capillary: 190 mg/dL — ABNORMAL HIGH (ref 70–99)
Glucose-Capillary: 182 mg/dL — ABNORMAL HIGH (ref 70–99)
Glucose-Capillary: 252 mg/dL — ABNORMAL HIGH (ref 70–99)

## 2021-01-11 LAB — D-DIMER, QUANTITATIVE: D-Dimer, Quant: 0.95 ug/mL-FEU — ABNORMAL HIGH (ref 0.00–0.50)

## 2021-01-11 LAB — C-REACTIVE PROTEIN: CRP: 3.1 mg/dL — ABNORMAL HIGH (ref <1.0)

## 2021-01-11 LAB — FERRITIN: Ferritin: 758 ng/mL — ABNORMAL HIGH (ref 24–336)

## 2021-01-11 NOTE — Progress Notes (Signed)
PT Cancellation Note  Patient Details Name: [name] Berg MRN: [id number] DOB: [birth year]   Cancelled Treatment:    Reason Eval/Treat Not Completed: (P) Patient at procedure or test/unavailable Pt is having [place] study. PT will follow back for Evaluation later today.  [name] Beverely Risen PT, DPT Acute Rehabilitation Services Pager [phone] Office [phone]    [name] 01/11/2021, 10:30 AM

## 2021-01-11 NOTE — Evaluation (Signed)
Physical Therapy Evaluation Patient Details Name: [name] Berg MRN: [id number] DOB: 1966-12-05 Today's Date: 01/11/2021   History of Present Illness  55 y.o. male with a known history of obesity, hypertension and chronic atrial fibrillation on anticoagulation with Xarelto, who presented to the ED 01/07/21 with acute onset of worsening dyspnea with associated dry cough and a tickly throat with occasional wheezing. Found to be 85%-89% SaO2 on RA, 91% SaO2 on 2L O2 via [place].  Escalated to 6 L nasal cannula.  Chest x-ray showed bilateral multiple infiltrates. Admitted for Acute hypoxemic respiratory failure and multifocal PNA secondary to COVID-19  Clinical Impression  PTA pt living with wife and 56 yo son in single story home with 3 steps to enter. Pt reports complete independence with mobility, ADLs and iADLs. Pt is currently limited in safe mobility by increased O2 demand and decreased endurance. Pt is mod I for bed mobility, transfers and ambulation without AD. Pt able to ambulate on 4L O2 via [place], however has 4/4 DoE at end of ambulation and requires 30 sec to recover. Unable to get good pleth wave form from O2 sensor and when changed SaO2 94%O2. Pt able to demonstrate flutter valve and IS use with max inhalation 2000 mL. Pt will have no PT needs at discharge, however PT will continue to follow acutely to increase endurance with activity prior to going home.     Follow Up Recommendations No PT follow up;Supervision - Intermittent    Equipment Recommendations  None recommended by PT       Precautions / Restrictions Precautions Precautions: None Restrictions Weight Bearing Restrictions: No      Mobility  Bed Mobility               General bed mobility comments: OOB in recliner    Transfers Overall transfer level: Modified independent               General transfer comment: increased effort, good stabilization in standing  Ambulation/Gait Ambulation/Gait assistance: Modified  independent (Device/Increase time) Gait Distance (Feet): 150 Feet Assistive device: None Gait Pattern/deviations: Step-through pattern;Decreased step length - right;Decreased step length - left;Wide base of support Gait velocity: slowed Gait velocity interpretation: 1.31 - 2.62 ft/sec, indicative of limited community ambulator General Gait Details: increase time and effort, for slowed waddling gait      Balance Overall balance assessment: Mild deficits observed, not formally tested                                           Pertinent Vitals/Pain Pain Assessment: No/denies pain    Home Living Family/patient expects to be discharged to:: Private residence Living Arrangements: Spouse/significant other;Children Available Help at Discharge: Family;Available 24 hours/day Type of Home: House Home Access: Stairs to enter   [employer] of Steps: 3 Home Layout: One level Home Equipment: None      Prior Function Level of Independence: Independent                  Extremity/Trunk Assessment   Upper Extremity Assessment Upper Extremity Assessment: Overall WFL for tasks assessed (body habitus limits ROM)    Lower Extremity Assessment Lower Extremity Assessment: Overall WFL for tasks assessed (body habitus limits ROM)       Communication   Communication: No difficulties  Cognition Arousal/Alertness: Awake/alert Behavior During Therapy: WFL for tasks assessed/performed Overall Cognitive Status: Within Functional Limits  for tasks assessed                                        General Comments General comments (skin integrity, edema, etc.): Pt on 4L O2 via HFNC with SaO2 at rest 99%O2, with ambulation of 50 feet stopped and assessed SaO2 94%O2, at end of ambulation unable to attain good pleth wave form pt with 3/4 DoE, by time good wave form attained SaO2 reading 92%O2, max HR noted with ambulation 134 bpm         Assessment/Plan    PT Assessment Patient needs continued PT services  PT Problem List Decreased activity tolerance;Cardiopulmonary status limiting activity       PT Treatment Interventions Gait training;Functional mobility training;Therapeutic activities;Therapeutic exercise;Stair training;Balance training;Cognitive remediation;Patient/family education    PT Goals (Current goals can be found in the Care Plan section)  Acute Rehab PT Goals Patient Stated Goal: get home to family PT Goal Formulation: With patient Time For Goal Achievement: 01/25/21 Potential to Achieve Goals: Good    Frequency Min 3X/week    AM-PAC PT "6 Clicks" Mobility  Outcome Measure Help needed turning from your back to your side while in a flat bed without using bedrails?: None Help needed moving from lying on your back to sitting on the side of a flat bed without using bedrails?: None Help needed moving to and from a bed to a chair (including a wheelchair)?: None Help needed standing up from a chair using your arms (e.g., wheelchair or bedside chair)?: None Help needed to walk in hospital room?: None Help needed climbing 3-5 steps with a railing? : A Little 6 Click Score: 23    End of Session Equipment Utilized During Treatment: Oxygen Activity Tolerance: Patient tolerated treatment well Patient left: in chair;with call bell/phone within reach Nurse Communication: Mobility status PT Visit Diagnosis: Other abnormalities of gait and mobility (R26.89);Difficulty in walking, not elsewhere classified (R26.2)    Time: 1324-4010 PT Time Calculation (min) (ACUTE ONLY): 31 min   Charges:   PT Evaluation $PT Eval Moderate Complexity: 1 Mod PT Treatments $Therapeutic Exercise: 8-22 mins        Tyreque Finken B. Beverely Risen PT, DPT Acute Rehabilitation Services Pager [phone] Office [phone]   [name] 01/11/2021, 11:53 AM

## 2021-01-11 NOTE — Progress Notes (Signed)
PROGRESS NOTE    [name] Berg  WUJ:[id number] DOB: 11/15/1966 DOA: 01/07/2021 PCP: Irena Reichmann, DO   Brief Narrative:  55 year old with history of obesity, HTN, chronic A. fib on Xarelto admitted to the hospital for worsening dyspnea, cough and wheezing.  He is not vaccinated against COVID-19, he was tested positive on 12/31/2020.  Initially hypoxic down to 85% on room air requiring 2 L nasal cannula which escalated to 6 L nasal cannula.  Chest x-ray showed bilateral multiple infiltrates. He was started on Solu-Medrol, remdesivir and baricitinib.   Assessment & Plan:   Active Problems:   Acute hypoxemic respiratory failure due to COVID-19 [facility])  Acute hypoxic respiratory failure secondary to COVID-19 pneumonia -Oxygen levels-3 L nasal cannula -Remdesivir- day 5/5 -Solumedrol 40mg  q12hrs-day 5 -Baricitinib-day 4 -Antibiotics- none -procalcitonin- negative -Chest x-ray-Bilateral multifocal infiltrates -Supportive care-antitussive, inhalers, I-S/flutter -CODE STATUS confirmed -Vitamin C & Zinc. Prone >16hrs/day.  -Routine: Labs have been reviewed including ferritin, LDH, CRP, d-dimer, fibrinogen.  Will need to trend this lab daily.  Hypokalemia - Resolved  Chronic atrial fibrillation - On Xarelto and Lopressor  Essential hypertension - Currently on Lopressor  Hyperglycemia secondary to steroid use; BS >200 - On sliding scale and Accu-Cheks.  Continue levemir 7U daily.   PT/OT   DVT prophylaxis: Xarelto Code Status: Full  Family Communication:  Called Cathie, no answer 1/23  Status is: Inpatient  Remains inpatient appropriate because:IV treatments appropriate due to intensity of illness or inability to take PO   Dispo: The patient is from: Home              Anticipated d/c is to: Home              Anticipated d/c date is: 1-2 days              Patient currently is not medically stable to d/c.  Maintain hospital stay for significant hypoxia requiring 2 L nasal  cannula.  Not on any home oxygen   Body mass index is 57.52 kg/m.    Subjective: Oxygen saturation continues to improve.  He denies any shortness of breath.  Notes improvement compared to yesterday  Review of Systems Otherwise negative except as per HPI, including: General: Denies fever, chills, night sweats or unintended weight loss. Resp: Denies cough, wheezing, shortness of breath. Cardiac: Denies chest pain, palpitations, orthopnea, paroxysmal nocturnal dyspnea. GI: Denies abdominal pain, nausea, vomiting, diarrhea or constipation GU: Denies dysuria, frequency, hesitancy or incontinence MS: Denies muscle aches, joint pain or swelling Neuro: Denies headache, neurologic deficits (focal weakness, numbness, tingling), abnormal gait Psych: Denies anxiety, depression, SI/HI/AVH Skin: Denies new rashes or lesions ID: Denies sick contacts, exotic exposures, travel  Examination: Constitutional: Not in acute distress, morbid obesity, 3 L nasal cannula Respiratory: Diminished breath sounds bilaterally Cardiovascular: Normal sinus rhythm, no rubs Abdomen: Nontender nondistended good bowel sounds Musculoskeletal: No edema noted Skin: No rashes seen Neurologic: CN 2-12 grossly intact.  And nonfocal Psychiatric: Normal judgment and insight. Alert and oriented x 3. Normal mood.   Objective: Vitals:   01/10/21 2040 01/11/21 0005 01/11/21 0459 01/11/21 0728  BP: (!) 156/97 (!) 123/96 (!) 130/93 (!) 157/114  Pulse: 97 79 82 81  Resp: 20 20 20 18   Temp: 97.9 F (36.6 C) 98.3 F (36.8 C) (!) 97.5 F (36.4 C) 98 F (36.7 C)  TempSrc: Oral Oral Oral Oral  SpO2: 90% 91% 90% 91%  Weight:      Height:  Intake/Output Summary (Last 24 hours) at 01/11/2021 0836 Last data filed at 01/10/2021 1855 Gross per 24 hour  Intake 240 ml  Output -  Net 240 ml   Filed Weights   01/07/21 1554  Weight: (!) 203.2 kg     Data Reviewed:   CBC: Recent Labs  Lab 01/07/21 1851  01/08/21 1042 01/09/21 0524 01/10/21 0221 01/11/21 0201  WBC 7.9 5.9 11.4* 11.5* 11.4*  NEUTROABS 5.7 5.0 9.7* 9.4* 9.4*  HGB 15.8 15.7 15.4 15.0 15.1  HCT 47.6 49.0 49.2 46.1 48.3  MCV 96.4 98.8 99.2 97.3 98.4  PLT 91* PLATELET CLUMPS NOTED ON SMEAR, UNABLE TO ESTIMATE 142* 192 158   Basic Metabolic Panel: Recent Labs  Lab 01/07/21 1851 01/08/21 1042 01/09/21 0524 01/10/21 0221 01/11/21 0201  NA 135 134* 138 138 139  K 3.4* 4.4 4.7 4.7 5.2*  CL 96* 98 101 104 105  CO2 26 23 27 23 25   GLUCOSE 135* 312* 311* 221* 229*  BUN 7 10 17  22* 22*  CREATININE 0.80 0.82 0.99 0.98 0.94  CALCIUM 8.0* 8.0* 8.6* 8.6* 8.8*   GFR: Estimated Creatinine Clearance: 164 mL/min (by C-G formula based on SCr of 0.94 mg/dL). Liver Function Tests: Recent Labs  Lab 01/07/21 1851 01/08/21 1042 01/09/21 0524 01/10/21 0221 01/11/21 0201  AST 45* 42* 31 33 42*  ALT 37 36 35 36 44  ALKPHOS 50 48 49 51 47  BILITOT 1.3* 1.5* 0.7 0.7 0.8  PROT 6.7 6.6 6.7 6.6 6.5  ALBUMIN 2.5* 2.4* 2.3* 2.3* 2.4*   No results for input(s): LIPASE, AMYLASE in the last 168 hours. No results for input(s): AMMONIA in the last 168 hours. Coagulation Profile: No results for input(s): INR, PROTIME in the last 168 hours. Cardiac Enzymes: No results for input(s): CKTOTAL, CKMB, CKMBINDEX, TROPONINI in the last 168 hours. BNP (last 3 results) No results for input(s): PROBNP in the last 8760 hours. HbA1C: Recent Labs    01/08/21 1042  HGBA1C 6.6*   CBG: Recent Labs  Lab 01/10/21 0811 01/10/21 1106 01/10/21 1521 01/10/21 2129 01/11/21 0732  GLUCAP 200* 230* 240* 196* 187*   Lipid Profile: No results for input(s): CHOL, HDL, LDLCALC, TRIG, CHOLHDL, LDLDIRECT in the last 72 hours. Thyroid Function Tests: No results for input(s): TSH, T4TOTAL, FREET4, T3FREE, THYROIDAB in the last 72 hours. Anemia Panel: Recent Labs    01/10/21 0221 01/11/21 0201  FERRITIN 676* 758*   Sepsis Labs: Recent Labs  Lab  01/07/21 1851 01/07/21 2034 01/07/21 2103  PROCALCITON <0.10 <0.10  --   LATICACIDVEN 2.2*  --  2.1*    Recent Results (from the past 240 hour(s))  Blood Culture (routine x 2)     Status: None (Preliminary result)   Collection Time: 01/07/21  7:00 PM   Specimen: BLOOD RIGHT HAND  Result Value Ref Range Status   Specimen Description BLOOD RIGHT HAND  Final   Special Requests   Final    BOTTLES DRAWN AEROBIC AND ANAEROBIC Blood Culture adequate volume   Culture   Final    NO GROWTH 2 DAYS Performed at [facility], [address], [place], [address] [place]    Report Status PENDING  Incomplete  Blood Culture (routine x 2)     Status: None (Preliminary result)   Collection Time: 01/07/21  7:13 PM   Specimen: BLOOD LEFT FOREARM  Result Value Ref Range Status   Specimen Description BLOOD LEFT FOREARM  Final   Special Requests  Final    BOTTLES DRAWN AEROBIC AND ANAEROBIC Blood Culture adequate volume   Culture   Final    NO GROWTH 2 DAYS Performed at [facility], [address] [place], [place] [address]    Report Status PENDING  Incomplete  MRSA PCR Screening     Status: Abnormal   Collection Time: 01/08/21  2:41 PM   Specimen: Nasopharyngeal  Result Value Ref Range Status   MRSA by PCR POSITIVE (A) NEGATIVE Final    Comment:        The GeneXpert MRSA Assay (FDA approved for NASAL specimens only), is one component of a comprehensive MRSA colonization surveillance program. It is not intended to diagnose MRSA infection nor to guide or monitor treatment for MRSA infections. RESULT CALLED TO, READ BACK BY AND VERIFIED WITHSilverio Decamp RN [id number] 01/08/21 A BROWNING Performed at [facility], [address], [place], [place] [address]          Radiology Studies: No results found.      Scheduled Meds: . vitamin C  500 mg Oral Daily  . baricitinib  4 mg Oral Daily  . Chlorhexidine Gluconate Cloth  6 each Topical Q0600  . cholecalciferol  1,000  Units Oral Daily  . famotidine  20 mg Oral BID  . guaiFENesin  600 mg Oral BID  . insulin aspart  0-15 Units Subcutaneous TID WC  . insulin aspart  0-5 Units Subcutaneous QHS  . insulin detemir  7 Units Subcutaneous Daily  . methylPREDNISolone (SOLU-MEDROL) injection  40 mg Intravenous Q12H  . metoprolol tartrate  100 mg Oral BID  . mupirocin ointment  1 application Nasal BID  . rivaroxaban  20 mg Oral Daily  . zinc sulfate  220 mg Oral Daily   Continuous Infusions: . remdesivir 100 mg in NS 100 mL 100 mg (01/10/21 0942)     LOS: 4 days   Time spent= 35 mins    Christoper Bushey Joline Maxcy, MD [facility]  If 7PM-7AM, please contact night-coverage  01/11/2021, 8:36 AM

## 2021-01-12 LAB — COMPREHENSIVE METABOLIC PANEL
ALT: 45 U/L — ABNORMAL HIGH (ref 0–44)
AST: 41 U/L (ref 15–41)
Albumin: 2.4 g/dL — ABNORMAL LOW (ref 3.5–5.0)
Alkaline Phosphatase: 47 U/L (ref 38–126)
Anion gap: 12 (ref 5–15)
BUN: 21 mg/dL — ABNORMAL HIGH (ref 6–20)
CO2: 24 mmol/L (ref 22–32)
Calcium: 8.6 mg/dL — ABNORMAL LOW (ref 8.9–10.3)
Chloride: 98 mmol/L (ref 98–111)
Creatinine, Ser: 0.93 mg/dL (ref 0.61–1.24)
GFR, Estimated: >60 mL/min (ref >60)
Glucose, Bld: 234 mg/dL — ABNORMAL HIGH (ref 70–99)
Potassium: 5.1 mmol/L (ref 3.5–5.1)
Sodium: 134 mmol/L — ABNORMAL LOW (ref 135–145)
Total Bilirubin: 1 mg/dL (ref 0.3–1.2)
Total Protein: 6.2 g/dL — ABNORMAL LOW (ref 6.5–8.1)

## 2021-01-12 LAB — CBC WITH DIFFERENTIAL/PLATELET
Abs Immature Granulocytes: 0.14 10*3/uL — ABNORMAL HIGH (ref 0.00–0.07)
Basophils Absolute: 0 10*3/uL (ref 0.0–0.1)
Basophils Relative: 0 %
Eosinophils Absolute: 0 10*3/uL (ref 0.0–0.5)
Eosinophils Relative: 0 %
HCT: 46.2 % (ref 39.0–52.0)
Hemoglobin: 15.3 g/dL (ref 13.0–17.0)
Immature Granulocytes: 1 %
Lymphocytes Relative: 5 %
Lymphs Abs: 0.7 10*3/uL (ref 0.7–4.0)
MCH: 31.9 pg (ref 26.0–34.0)
MCHC: 33.1 g/dL (ref 30.0–36.0)
MCV: 96.5 fL (ref 80.0–100.0)
Monocytes Absolute: 0.8 10*3/uL (ref 0.1–1.0)
Monocytes Relative: 6 %
Neutro Abs: 11.3 10*3/uL — ABNORMAL HIGH (ref 1.7–7.7)
Neutrophils Relative %: 88 %
Platelets: 144 10*3/uL — ABNORMAL LOW (ref 150–400)
RBC: 4.79 MIL/uL (ref 4.22–5.81)
RDW: 12.9 % (ref 11.5–15.5)
WBC: 12.9 10*3/uL — ABNORMAL HIGH (ref 4.0–10.5)
nRBC: 0 % (ref 0.0–0.2)

## 2021-01-12 LAB — D-DIMER, QUANTITATIVE: D-Dimer, Quant: 1.49 ug/mL-FEU — ABNORMAL HIGH (ref 0.00–0.50)

## 2021-01-12 LAB — C-REACTIVE PROTEIN: CRP: 1.9 mg/dL — ABNORMAL HIGH (ref <1.0)

## 2021-01-12 LAB — GLUCOSE, CAPILLARY
Glucose-Capillary: 198 mg/dL — ABNORMAL HIGH (ref 70–99)
Glucose-Capillary: 198 mg/dL — ABNORMAL HIGH (ref 70–99)
Glucose-Capillary: 245 mg/dL — ABNORMAL HIGH (ref 70–99)
Glucose-Capillary: 257 mg/dL — ABNORMAL HIGH (ref 70–99)

## 2021-01-12 LAB — FERRITIN: Ferritin: 661 ng/mL — ABNORMAL HIGH (ref 24–336)

## 2021-01-12 MED ORDER — PREDNISONE 5 MG PO TABS
50.0000 mg | ORAL_TABLET | Freq: Every day | ORAL | Status: DC
  Administered 2021-01-12 – 2021-01-13 (×2): 50 mg via ORAL
  Filled 2021-01-12 (×2): qty 2

## 2021-01-12 NOTE — Progress Notes (Signed)
Inpatient Diabetes Program Recommendations  AACE/ADA: New Consensus Statement on Inpatient Glycemic Control (2015)  Target Ranges:  Prepandial:   less than 140 mg/dL      Peak postprandial:   less than 180 mg/dL (1-2 hours)      Critically ill patients:  140 - 180 mg/dL   Lab Results  Component Value Date   GLUCAP 198 (H) 01/12/2021   HGBA1C 6.6 (H) 01/08/2021   Review of Glycemic Control Results for [name] Berg, [name] Berg (MRN [id number]) as of 01/12/2021 12:24  Ref. Range 01/11/2021 07:32 01/11/2021 12:44 01/11/2021 17:03 01/11/2021 21:30 01/12/2021 07:37  Glucose-Capillary Latest Ref Range: 70 - 99 mg/dL 067 (H) 703 (H) 403 (H) 252 (H) 198 (H)   Diabetes history: none steroid use Current orders for Inpatient glycemic control:  Levemir 7 units Daily Novolog 0-15 units tid + hs  A1c 6.6% on 1/21 PO Prednisone 50 mg Daily  Inpatient Diabetes Program Recommendations:    -  Consider increasing Levemir to 10 units Daily.   Thanks,  Christena Deem RN, MSN, BC-ADM Inpatient Diabetes Coordinator Team Pager [phone] (8a-5p)

## 2021-01-12 NOTE — Evaluation (Signed)
Occupational Therapy Evaluation Patient Details Name: [name] Berg MRN: [id number] DOB: Aug 19, 1966 Today's Date: 01/12/2021    History of Present Illness 55 y.o. male with a known history of obesity, hypertension and chronic atrial fibrillation on anticoagulation with Xarelto, who presented to the ED 01/07/21 with acute onset of worsening dyspnea with associated dry cough and a tickly throat with occasional wheezing. Found to be 85%-89% SaO2 on RA, 91% SaO2 on 2L O2 via [place].  Escalated to 6 L nasal cannula.  Chest x-ray showed bilateral multiple infiltrates. Admitted for Acute hypoxemic respiratory failure and multifocal PNA secondary to COVID-19   Clinical Impression   This 55 y/o male presents with the above. PTA pt reports being independent with ADL, iADL and functional mobility. Today pt completing room and hallway level mobility tasks at supervision - mod independent level without AD; overall completing ADL with up to minguard assist. Pt able to perform mobility tasks on RA today with SpO2 >/=87%, but with tachy HR up to the 160s during mobility (decreases appropriately with standing/seated rest), MD in end of session and aware. Pt with 2-3 DOE during activity with further education provided on energy conservation techniques and activity progression after return home with pt verbalizing understanding. Pt will benefit from continued acute OT services to maximize his overall strength, endurance, safety and independence with ADL and mobility; do not anticipate he'll require follow up OT services after discharge.     Follow Up Recommendations  No OT follow up;Supervision - Intermittent    Equipment Recommendations  None recommended by OT;Other (comment) (pt declines DME for shower)           Precautions / Restrictions Precautions Precautions: None Restrictions Weight Bearing Restrictions: No      Mobility Bed Mobility               General bed mobility comments: OOB in recliner     Transfers Overall transfer level: Modified independent               General transfer comment: increased effort, good stabilization in standing    Balance Overall balance assessment: Mild deficits observed, not formally tested                                         ADL either performed or assessed with clinical judgement   ADL Overall ADL's : Needs assistance/impaired Eating/Feeding: Modified independent   Grooming: Modified independent;Standing   Upper Body Bathing: Modified independent;Sitting   Lower Body Bathing: Min guard;Sit to/from stand   Upper Body Dressing : Modified independent;Sitting   Lower Body Dressing: Min guard;Sit to/from stand   Toilet Transfer: Modified Independent;Ambulation Toilet Transfer Details (indicate cue type and reason): simulated via transfer to/from recliner Toileting- Clothing Manipulation and Hygiene: Modified independent;Sit to/from stand       Functional mobility during ADLs: Modified independent                           Pertinent Vitals/Pain Pain Assessment: No/denies pain     Hand Dominance     Extremity/Trunk Assessment Upper Extremity Assessment Upper Extremity Assessment: Overall WFL for tasks assessed   Lower Extremity Assessment Lower Extremity Assessment: Defer to PT evaluation       Communication Communication Communication: No difficulties   Cognition Arousal/Alertness: Awake/alert Behavior During Therapy: WFL for tasks assessed/performed Overall Cognitive Status: Within  Functional Limits for tasks assessed                                     General Comments       Exercises Exercises: Other exercises Other Exercises Other Exercises: flutter valve and IS x2-3, pulling to on IS   Shoulder Instructions      Home Living Family/patient expects to be discharged to:: Private residence Living Arrangements: Spouse/significant other;Children Available  Help at Discharge: Family;Available 24 hours/day Type of Home: House Home Access: Stairs to enter [employer] of Steps: 3   Home Layout: One level     Bathroom Shower/Tub: Chief Strategy Officer: Standard Bathroom Accessibility: Yes   Home Equipment: None          Prior Functioning/Environment Level of Independence: Independent                 OT Problem List: Decreased activity tolerance;Cardiopulmonary status limiting activity;Obesity;Decreased knowledge of use of DME or AE      OT Treatment/Interventions: Self-care/ADL training;Therapeutic exercise;Energy conservation;DME and/or AE instruction;Therapeutic activities;Patient/family education;Balance training    OT Goals(Current goals can be found in the care plan section) Acute Rehab OT Goals Patient Stated Goal: get home to family OT Goal Formulation: With patient Time For Goal Achievement: 01/26/21 Potential to Achieve Goals: Good  OT Frequency: Min 2X/week   Barriers to D/C:            Co-evaluation              AM-PAC OT "6 Clicks" Daily Activity     Outcome Measure Help from another person eating meals?: None Help from another person taking care of personal grooming?: None Help from another person toileting, which includes using toliet, bedpan, or urinal?: None Help from another person bathing (including washing, rinsing, drying)?: None Help from another person to put on and taking off regular upper body clothing?: None Help from another person to put on and taking off regular lower body clothing?: A Little 6 Click Score: 23   End of Session Nurse Communication: Mobility status  Activity Tolerance: Patient tolerated treatment well Patient left: in chair;with call bell/phone within reach  OT Visit Diagnosis: Other (comment) (decreased cardiorespiratory status)                Time: 7048-8891 OT Time Calculation (min): 32 min Charges:  OT General Charges $OT Visit: 1  Visit OT Evaluation $OT Eval Moderate Complexity: 1 Mod OT Treatments $Self Care/Home Management : 8-22 mins  Marcy Siren, OT Acute Rehabilitation Services Pager [phone] Office [phone]    [name] 01/12/2021, 12:11 PM

## 2021-01-12 NOTE — Progress Notes (Addendum)
PROGRESS NOTE    Kyrollos Cordell  ZOX:[id number] DOB: May 29, 1966 DOA: 01/07/2021 PCP: Irena Reichmann, DO   Brief Narrative:  55 year old with history of obesity, HTN, chronic A. fib on Xarelto admitted to the hospital for worsening dyspnea, cough and wheezing.  He is not vaccinated against COVID-19, he was tested positive on 12/31/2020.  Initially hypoxic down to 85% on room air requiring 2 L nasal cannula which escalated to 6 L nasal cannula.  Chest x-ray showed bilateral multiple infiltrates. He was started on Solu-Medrol, remdesivir and baricitinib.  Initially required as high as 15 L high flow nasal cannula which was slowly weaned down.   Assessment & Plan:   Active Problems:   Acute hypoxemic respiratory failure due to COVID-19 [facility])  Acute hypoxic respiratory failure secondary to COVID-19 pneumonia -Oxygen levels-2 L nasal cannula -Remdesivir-completed 5-day course 1/24 -Solumedrol 40mg  q12hrs-transition to p.o. prednisone 40 mg daily -Baricitinib-day 5 -Antibiotics- none -procalcitonin- negative -Chest x-ray-Bilateral multifocal infiltrates -Supportive care-antitussive, inhalers, I-S/flutter -CODE STATUS confirmed -Vitamin C & Zinc. Prone >16hrs/day.  -Routine: Labs have been reviewed including ferritin, LDH, CRP, d-dimer, fibrinogen.  Will need to trend this lab daily.  Hypokalemia - Resolved  Chronic atrial fibrillation - On Xarelto and Lopressor  Essential hypertension - Currently on Lopressor  Hyperglycemia secondary to steroid use; BS >200 - On sliding scale and Accu-Cheks.  Continue levemir 7U daily.   PT/OT   DVT prophylaxis: Xarelto Code Status: Full  Family Communication:  Called Cathie updated 1/25  Status is: Inpatient  Remains inpatient appropriate because:IV treatments appropriate due to intensity of illness or inability to take PO   Dispo: The patient is from: Home              Anticipated d/c is to: Home              Anticipated d/c date is: 1  day              Patient currently is not medically stable to d/c.  Will wean him off oxygen, transition him to p.o. steroids.  If remains stable he can go home tomorrow Body mass index is 57.52 kg/m.    Subjective: His oxygen saturations have improved and with ambulation it staying above 90% on 2 L nasal cannula but he is getting very tachycardic with heart rate in 140s.  At rest his heart rate improved to 102.  Symptomatically he states he feels much better.  Review of Systems Otherwise negative except as per HPI, including: General: Denies fever, chills, night sweats or unintended weight loss. Resp: Denies hemoptysis Cardiac: Denies chest pain, palpitations, orthopnea, paroxysmal nocturnal dyspnea. GI: Denies abdominal pain, nausea, vomiting, diarrhea or constipation GU: Denies dysuria, frequency, hesitancy or incontinence MS: Denies muscle aches, joint pain or swelling Neuro: Denies headache, neurologic deficits (focal weakness, numbness, tingling), abnormal gait Psych: Denies anxiety, depression, SI/HI/AVH Skin: Denies new rashes or lesions ID: Denies sick contacts, exotic exposures, travel  Examination: Constitutional: Not in acute distress, morbid obesity.  2 L nasal cannula Respiratory: Diffuse diminished breath sounds Cardiovascular: Normal sinus rhythm, no rubs Abdomen: Nontender nondistended good bowel sounds Musculoskeletal: No edema noted Skin: No rashes seen Neurologic: CN 2-12 grossly intact.  And nonfocal Psychiatric: Normal judgment and insight. Alert and oriented x 3. Normal mood. Objective: Vitals:   01/11/21 1241 01/11/21 1548 01/11/21 2127 01/12/21 0457  BP: (!) 151/80 (!) 135/116 (!) 149/100 (!) 140/100  Pulse: 72 84 95 85  Resp: 19 17 18 18   Temp:  97.8 F (36.6 C) (!) 97.5 F (36.4 C) (!) 97.5 F (36.4 C) (!) 97.5 F (36.4 C)  TempSrc: Oral Oral Oral Oral  SpO2: 100% 99% 92% 90%  Weight:      Height:        Intake/Output Summary (Last 24 hours)  at 01/12/2021 1109 Last data filed at 01/12/2021 0900 Gross per 24 hour  Intake 730 ml  Output --  Net 730 ml   Filed Weights   01/07/21 1554  Weight: (!) 203.2 kg     Data Reviewed:   CBC: Recent Labs  Lab 01/08/21 1042 01/09/21 0524 01/10/21 0221 01/11/21 0201 01/12/21 0203  WBC 5.9 11.4* 11.5* 11.4* 12.9*  NEUTROABS 5.0 9.7* 9.4* 9.4* 11.3*  HGB 15.7 15.4 15.0 15.1 15.3  HCT 49.0 49.2 46.1 48.3 46.2  MCV 98.8 99.2 97.3 98.4 96.5  PLT PLATELET CLUMPS NOTED ON SMEAR, UNABLE TO ESTIMATE 142* 192 158 144*   Basic Metabolic Panel: Recent Labs  Lab 01/08/21 1042 01/09/21 0524 01/10/21 0221 01/11/21 0201 01/12/21 0203  NA 134* 138 138 139 134*  K 4.4 4.7 4.7 5.2* 5.1  CL 98 101 104 105 98  CO2 23 27 23 25 24   GLUCOSE 312* 311* 221* 229* 234*  BUN 10 17 22* 22* 21*  CREATININE 0.82 0.99 0.98 0.94 0.93  CALCIUM 8.0* 8.6* 8.6* 8.8* 8.6*   GFR: Estimated Creatinine Clearance: 165.8 mL/min (by C-G formula based on SCr of 0.93 mg/dL). Liver Function Tests: Recent Labs  Lab 01/08/21 1042 01/09/21 0524 01/10/21 0221 01/11/21 0201 01/12/21 0203  AST 42* 31 33 42* 41  ALT 36 35 36 44 45*  ALKPHOS 48 49 51 47 47  BILITOT 1.5* 0.7 0.7 0.8 1.0  PROT 6.6 6.7 6.6 6.5 6.2*  ALBUMIN 2.4* 2.3* 2.3* 2.4* 2.4*   No results for input(s): LIPASE, AMYLASE in the last 168 hours. No results for input(s): AMMONIA in the last 168 hours. Coagulation Profile: No results for input(s): INR, PROTIME in the last 168 hours. Cardiac Enzymes: No results for input(s): CKTOTAL, CKMB, CKMBINDEX, TROPONINI in the last 168 hours. BNP (last 3 results) No results for input(s): PROBNP in the last 8760 hours. HbA1C: No results for input(s): HGBA1C in the last 72 hours. CBG: Recent Labs  Lab 01/11/21 0732 01/11/21 1244 01/11/21 1703 01/11/21 2130 01/12/21 0737  GLUCAP 187* 190* 182* 252* 198*   Lipid Profile: No results for input(s): CHOL, HDL, LDLCALC, TRIG, CHOLHDL, LDLDIRECT in  the last 72 hours. Thyroid Function Tests: No results for input(s): TSH, T4TOTAL, FREET4, T3FREE, THYROIDAB in the last 72 hours. Anemia Panel: Recent Labs    01/11/21 0201 01/12/21 0203  FERRITIN 758* 661*   Sepsis Labs: Recent Labs  Lab 01/07/21 1851 01/07/21 2034 01/07/21 2103  PROCALCITON <0.10 <0.10  --   LATICACIDVEN 2.2*  --  2.1*    Recent Results (from the past 240 hour(s))  Blood Culture (routine x 2)     Status: None (Preliminary result)   Collection Time: 01/07/21  7:00 PM   Specimen: BLOOD RIGHT HAND  Result Value Ref Range Status   Specimen Description BLOOD RIGHT HAND  Final   Special Requests   Final    BOTTLES DRAWN AEROBIC AND ANAEROBIC Blood Culture adequate volume   Culture   Final    NO GROWTH 3 DAYS Performed at [facility], [address] [place], [place]    Report Status PENDING  Incomplete  Blood Culture (routine  x 2)     Status: None (Preliminary result)   Collection Time: 01/07/21  7:13 PM   Specimen: BLOOD LEFT FOREARM  Result Value Ref Range Status   Specimen Description BLOOD LEFT FOREARM  Final   Special Requests   Final    BOTTLES DRAWN AEROBIC AND ANAEROBIC Blood Culture adequate volume   Culture   Final    NO GROWTH 3 DAYS Performed at [facility], [address] [place], [place] [address]    Report Status PENDING  Incomplete  MRSA PCR Screening     Status: Abnormal   Collection Time: 01/08/21  2:41 PM   Specimen: Nasopharyngeal  Result Value Ref Range Status   MRSA by PCR POSITIVE (A) NEGATIVE Final    Comment:        The GeneXpert MRSA Assay (FDA approved for NASAL specimens only), is one component of a comprehensive MRSA colonization surveillance program. It is not intended to diagnose MRSA infection nor to guide or monitor treatment for MRSA infections. RESULT CALLED TO, READ BACK BY AND VERIFIED WITHSilverio Decamp RN 1950 01/08/21 A BROWNING Performed at [facility], [address],  [place], [place] [address]          Radiology Studies: No results found.      Scheduled Meds: . vitamin C  500 mg Oral Daily  . baricitinib  4 mg Oral Daily  . Chlorhexidine Gluconate Cloth  6 each Topical Q0600  . cholecalciferol  1,000 Units Oral Daily  . famotidine  20 mg Oral BID  . guaiFENesin  600 mg Oral BID  . insulin aspart  0-15 Units Subcutaneous TID WC  . insulin aspart  0-5 Units Subcutaneous QHS  . insulin detemir  7 Units Subcutaneous Daily  . metoprolol tartrate  100 mg Oral BID  . mupirocin ointment  1 application Nasal BID  . predniSONE  50 mg Oral Q breakfast  . rivaroxaban  20 mg Oral Daily  . zinc sulfate  220 mg Oral Daily   Continuous Infusions:    LOS: 5 days   Time spent= 35 mins    Mosiah Bastin Joline Maxcy, MD [facility]  If 7PM-7AM, please contact night-coverage  01/12/2021, 11:09 AM

## 2021-01-12 NOTE — Progress Notes (Addendum)
SATURATION QUALIFICATIONS: (This note is used to comply with regulatory documentation for home oxygen)  Patient Saturations on Room Air at Rest = 96%  Patient Saturations on Room Air while Ambulating =87%  Patient Saturations on N/a Liters of oxygen while Ambulating = N/A, pt able to maintain O2 at 87% or greater without supplemental O2.   Full progress noted to follow.  Marcy Siren, OT Acute Rehabilitation Services Pager [phone] Office [phone]

## 2021-01-13 DIAGNOSIS — J9601 Acute respiratory failure with hypoxia: Secondary | ICD-10-CM

## 2021-01-13 DIAGNOSIS — U071 COVID-19: Principal | ICD-10-CM

## 2021-01-13 LAB — CULTURE, BLOOD (ROUTINE X 2)
Culture: NO GROWTH
Culture: NO GROWTH
Special Requests: ADEQUATE
Special Requests: ADEQUATE

## 2021-01-13 LAB — GLUCOSE, CAPILLARY
Glucose-Capillary: 143 mg/dL — ABNORMAL HIGH (ref 70–99)
Glucose-Capillary: 192 mg/dL — ABNORMAL HIGH (ref 70–99)

## 2021-01-13 MED ORDER — PANTOPRAZOLE SODIUM 40 MG PO TBEC: 40.0000 mg | DELAYED_RELEASE_TABLET | Freq: Every day | ORAL | 0 refills | Status: DC

## 2021-01-13 MED ORDER — DEXAMETHASONE 6 MG PO TABS: 6.0000 mg | ORAL_TABLET | Freq: Every day | ORAL | 0 refills | Status: DC

## 2021-01-13 NOTE — Progress Notes (Signed)
Patient discharging home. Vital signs stable at time of discharge as reflected in discharge summary. Discharge instructions given and verbal understanding returned. Patient to follow up with PCP within a week. Patient discharging with O2 tank and compressor.

## 2021-01-13 NOTE — Discharge Summary (Signed)
[name] Berg, is a 55 y.o. male  DOB [birth year]  MRN [id number].  Admission date:  01/07/2021  Admitting Physician  Hannah Beat, MD  Discharge Date:  01/13/2021   Primary MD  Irena Reichmann, DO  Recommendations for primary care physician for things to follow:  -Please check CBC, CMP during next visit -A1c 6.6 on discharge, please follow as an outpatient.  Admission Diagnosis  Hypoxia [R09.02] Acute hypoxemic respiratory failure due to COVID-19 (HCC) [U07.1, J96.01] Pneumonia due to COVID-19 virus [U07.1, J12.82] COVID-19 [U07.1]   Discharge Diagnosis  Hypoxia [R09.02] Acute hypoxemic respiratory failure due to COVID-19 (HCC) [U07.1, J96.01] Pneumonia due to COVID-19 virus [U07.1, J12.82] COVID-19 [U07.1]    Active Problems:   Acute hypoxemic respiratory failure due to COVID-19 [facility])      History reviewed. No pertinent past medical history.  History reviewed. No pertinent surgical history.     History of present illness and  Hospital Course:     Kindly see H&P for history of present illness and admission details, please review complete Labs, Consult reports and Test reports for all details in brief  HPI  from the history and physical done on the day of admission 01/07/2021  [name] Berg  is a 55 y.o.Caucasian male with a known history of obesity, hypertension and chronic atrial fibrillation on anticoagulation with Xarelto, who presented to the emergency room with acute onset of worsening dyspnea with associated dry cough and a tickly throat with occasional wheezing.  Symptoms started couple weeks ago with postnasal drip and generalized weakness he denied loss of taste or smell however he stated that he has been very sensitive to salt.  No nausea or vomiting or diarrhea.  No chest pain or palpitations.  He denies any fever or chills.  Today's pulse oximetry was 85 to 89% on room air and up to 91% on 2  L of O2 by nasal cannula.  He has not been vaccinated for COVID-19 and regrets it.  Upon presentation to the emergency room, respiratory rate was 24 and blood pressure was 132/94 with temperature of 98.8 and pulse and has been ranging from 90 to 93% on 2- 3 L O2 by nasal cannula.  Labs revealed mild hypokalemia and AST 45 with BNP of 431.6.  LDH was 425 and ferritin 939 with a CRP of 12.6 and later 12.9 lactic acid of 2.2 and later 2.1 with procalcitonin of less than 0.1.  CBC showed thrombocytopenia and D-dimer was 1.36 and later 1.5 with fibrinogen 647. EKG showed atrial fibrillation with controlled ventricular sponsor of 90 with right axis deviation and low voltage QRS with prolonged QT interval (QTC 519 MS. chest x-ray showed marked severity bilateral multifocal infiltrates.  The patient was given IV remdesivir.  He will be admitted to a medical monitored telemetry bed for further evaluation and management.  Hospital Course    Acute hypoxic respiratory failure secondary to COVID-19 pneumonia -Patient was treated with steroids, Remdesivir, and baricitinib, hypoxia has improved, he is requiring oxygen only with  activity, he will be discharged home on dexamethasone to finish total of 10 days of steroid treatment, he was encouraged to take his incentive spirometry and flutter valve device at home and keep using it.  Hypokalemia - Resolved  Chronic atrial fibrillation - On Xarelto and Lopressor  Essential hypertension - Home medications  Hyperglycemia  -With A1c of 6.6, he did require low-dose Levemir and sliding scale during hospital stay, he was counseled about diet .   Discharge Condition:  stable     Discharge Instructions  and  Discharge Medications   Discharge Instructions    Discharge instructions   Complete by: As directed    Follow with Primary MD Irena Reichmann, DO in 14 days   Get CBC, CMP, checked  by Primary MD next visit.    Activity: As tolerated with Full  fall precautions use walker/cane & assistance as needed   Disposition Home    Diet: Heart Healthy , with feeding assistance and aspiration precautions.  For Heart failure patients - Check your Weight same time everyday, if you gain over 2 pounds, or you develop in leg swelling, experience more shortness of breath or chest pain, call your Primary MD immediately. Follow Cardiac Low Salt Diet and 1.5 lit/day fluid restriction.   On your next visit with your primary care physician please Get Medicines reviewed and adjusted.   Please request your Prim.MD to go over all Hospital Tests and Procedure/Radiological results at the follow up, please get all Hospital records sent to your Prim MD by signing hospital release before you go home.   If you experience worsening of your admission symptoms, develop shortness of breath, life threatening emergency, suicidal or homicidal thoughts you must seek medical attention immediately by calling 911 or calling your MD immediately  if symptoms less severe.  You Must read complete instructions/literature along with all the possible adverse reactions/side effects for all the Medicines you take and that have been prescribed to you. Take any new Medicines after you have completely understood and accpet all the possible adverse reactions/side effects.   Do not drive, operating heavy machinery, perform activities at heights, swimming or participation in water activities or provide baby sitting services if your were admitted for syncope or siezures until you have seen by Primary MD or a Neurologist and advised to do so again.  Do not drive when taking Pain medications.    Do not take more than prescribed Pain, Sleep and Anxiety Medications  Special Instructions: If you have smoked or chewed Tobacco  in the last 2 yrs please stop smoking, stop any regular Alcohol  and or any Recreational drug use.  Wear Seat belts while driving.   Please note  You were cared  for by a hospitalist during your hospital stay. If you have any questions about your discharge medications or the care you received while you were in the hospital after you are discharged, you can call the unit and asked to speak with the hospitalist on call if the hospitalist that took care of you is not available. Once you are discharged, your primary care physician will handle any further medical issues. Please note that NO REFILLS for any discharge medications will be authorized once you are discharged, as it is imperative that you return to your primary care physician (or establish a relationship with a primary care physician if you do not have one) for your aftercare needs so that they can reassess your need for medications and monitor your lab values.  Increase activity slowly   Complete by: As directed      Allergies as of 01/13/2021   No Known Allergies     Medication List    TAKE these medications   albuterol 108 (90 Base) MCG/ACT inhaler Commonly known as: VENTOLIN HFA Inhale 2 puffs into the lungs every 4 (four) hours as needed for wheezing or shortness of breath.   dexamethasone 6 MG tablet Commonly known as: DECADRON Take 1 tablet (6 mg total) by mouth daily. Start taking on: January 14, 2021   losartan-hydrochlorothiazide 50-12.5 MG tablet Commonly known as: HYZAAR Take 1 tablet by mouth daily.   metoprolol tartrate 100 MG tablet Commonly known as: LOPRESSOR Take 1 tablet (100 mg total) by mouth 2 (two) times daily.   pantoprazole 40 MG tablet Commonly known as: Protonix Take 1 tablet (40 mg total) by mouth daily.   rivaroxaban 20 MG Tabs tablet Commonly known as: XARELTO Take 20 mg by mouth every evening.            Durable Medical Equipment  (From admission, onward)         Start     Ordered   01/13/21 1053  For home use only DME oxygen  Once       Question Answer Comment  Length of Need 6 Months   Mode or (Route) Nasal cannula   Liters per Minute  2   Frequency Continuous (stationary and portable oxygen unit needed)   Oxygen conserving device Yes   Oxygen delivery system Gas      01/13/21 1052            Diet and Activity recommendation: See Discharge Instructions above   Consults obtained -  none   Major procedures and Radiology Reports - PLEASE review detailed and final reports for all details, in brief -      DG Chest Portable 1 View  Result Date: 01/07/2021 CLINICAL DATA:  COVID positive with shortness of breath. EXAM: PORTABLE CHEST 1 VIEW COMPARISON:  December 26, 2014 FINDINGS: Marked severity multifocal infiltrates are seen throughout both lungs. There is no evidence of a pleural effusion or pneumothorax. The heart size and mediastinal contours are within normal limits. The visualized skeletal structures are unremarkable. IMPRESSION: Marked severity bilateral multifocal infiltrates. Electronically Signed   By: Aram Candela M.D.   On: 01/07/2021 16:19    Micro Results     Recent Results (from the past 240 hour(s))  Blood Culture (routine x 2)     Status: None   Collection Time: 01/07/21  7:00 PM   Specimen: BLOOD RIGHT HAND  Result Value Ref Range Status   Specimen Description BLOOD RIGHT HAND  Final   Special Requests   Final    BOTTLES DRAWN AEROBIC AND ANAEROBIC Blood Culture adequate volume   Culture   Final    NO GROWTH 5 DAYS Performed at [facility], [address] [place], [place] [address]    Report Status 01/13/2021 FINAL  Final  Blood Culture (routine x 2)     Status: None   Collection Time: 01/07/21  7:13 PM   Specimen: BLOOD LEFT FOREARM  Result Value Ref Range Status   Specimen Description BLOOD LEFT FOREARM  Final   Special Requests   Final    BOTTLES DRAWN AEROBIC AND ANAEROBIC Blood Culture adequate volume   Culture   Final    NO GROWTH 5 DAYS Performed at [facility], [address], [place],  [place] [address]    Report Status 01/13/2021 FINAL  Final  MRSA  PCR Screening     Status: Abnormal   Collection Time: 01/08/21  2:41 PM   Specimen: Nasopharyngeal  Result Value Ref Range Status   MRSA by PCR POSITIVE (A) NEGATIVE Final    Comment:        The GeneXpert MRSA Assay (FDA approved for NASAL specimens only), is one component of a comprehensive MRSA colonization surveillance program. It is not intended to diagnose MRSA infection nor to guide or monitor treatment for MRSA infections. RESULT CALLED TO, READ BACK BY AND VERIFIED WITHSilverio Decamp RN [phone] 01/08/21 A BROWNING Performed at [facility], [address] [place], [place] [address]        Today   Subjective:   [name] Berg today has no headache,no chest abdominal pain,no new weakness tingling or numbness, feels much better wants to go home today.  Patient ambulation with PT today, was desaturation was 88% on room air with activity.  Objective:   Blood pressure (!) 132/95, pulse 73, temperature 97.7 F (36.5 C), temperature source Oral, resp. rate 18, height 6\' 2"  (1.88 m), weight (!) 203.2 kg, SpO2 (!) 88 %.   Intake/Output Summary (Last 24 hours) at 01/13/2021 1132 Last data filed at 01/13/2021 0531 Gross per 24 hour  Intake 960 ml  Output --  Net 960 ml    Exam Awake Alert, Oriented x 3, No new F.N deficits, Normal affect Symmetrical Chest wall movement, Good air movement bilaterally, CTAB RRR,No Gallops,Rubs or new Murmurs, No Parasternal Heave +ve B.Sounds, Abd Soft,No rebound -guarding or rigidity. No Cyanosis, Clubbing or edema, No new Rash or bruise  Data Review   CBC w Diff:  Lab Results  Component Value Date   WBC 12.9 (H) 01/12/2021   HGB 15.3 01/12/2021   HCT 46.2 01/12/2021   PLT 144 (L) 01/12/2021   LYMPHOPCT 5 01/12/2021   MONOPCT 6 01/12/2021   EOSPCT 0 01/12/2021   BASOPCT 0 01/12/2021    CMP:  Lab Results  Component Value Date   NA 134 (L) 01/12/2021   K 5.1 01/12/2021   CL 98 01/12/2021   CO2 24 01/12/2021   BUN 21 (H)  01/12/2021   CREATININE 0.93 01/12/2021   PROT 6.2 (L) 01/12/2021   ALBUMIN 2.4 (L) 01/12/2021   BILITOT 1.0 01/12/2021   ALKPHOS 47 01/12/2021   AST 41 01/12/2021   ALT 45 (H) 01/12/2021  .   Total Time in preparing paper work, data evaluation and todays exam - 35 minutes  01/14/2021 M.D on 01/13/2021 at 11:32 AM  [facility]   Office  [phone]

## 2021-01-13 NOTE — Progress Notes (Addendum)
PT Progress Note  SATURATION QUALIFICATIONS: (This note is used to comply with regulatory documentation for home oxygen)  Patient Saturations on Room Air at Rest = 100%  Patient Saturations on Room Air while Ambulating = 88%   Please briefly explain why patient needs home oxygen: Desat to 88% during ambulation. Quick return to > 90% with standing/seated rest break. Pt declining need for home O2.   Aida Raider, PT  Office # [phone] Pager [phone]

## 2021-01-13 NOTE — Discharge Instructions (Signed)
Person Under Monitoring Name: [name] Berg  Location: [address] [place] [address]   Infection Prevention Recommendations for Individuals Confirmed to have, or Being Evaluated for, 2019 Novel Coronavirus (COVID-19) Infection Who Receive Care at Home  Individuals who are confirmed to have, or are being evaluated for, COVID-19 should follow the prevention steps below until a healthcare provider or local or state health department says they can return to normal activities.  Stay home except to get medical care You should restrict activities outside your home, except for getting medical care. Do not go to work, school, or public areas, and do not use public transportation or taxis.  Call ahead before visiting your doctor Before your medical appointment, call the healthcare provider and tell them that you have, or are being evaluated for, COVID-19 infection. This will help the healthcare providers office take steps to keep other people from getting infected. Ask your healthcare provider to call the local or state health department.  Monitor your symptoms Seek prompt medical attention if your illness is worsening (e.g., difficulty breathing). Before going to your medical appointment, call the healthcare provider and tell them that you have, or are being evaluated for, COVID-19 infection. Ask your healthcare provider to call the local or state health department.  Wear a facemask You should wear a facemask that covers your nose and mouth when you are in the same room with other people and when you visit a healthcare provider. People who live with or visit you should also wear a facemask while they are in the same room with you.  Separate yourself from other people in your home As much as possible, you should stay in a different room from other people in your home. Also, you should use a separate bathroom, if available.  Avoid sharing household items You should not share dishes,  drinking glasses, cups, eating utensils, towels, bedding, or other items with other people in your home. After using these items, you should wash them thoroughly with soap and water.  Cover your coughs and sneezes Cover your mouth and nose with a tissue when you cough or sneeze, or you can cough or sneeze into your sleeve. Throw used tissues in a lined trash can, and immediately wash your hands with soap and water for at least 20 seconds or use an alcohol-based hand rub.  Wash your [employer] your hands often and thoroughly with soap and water for at least 20 seconds. You can use an alcohol-based hand sanitizer if soap and water are not available and if your hands are not visibly dirty. Avoid touching your eyes, nose, and mouth with unwashed hands.   Prevention Steps for Caregivers and Household Members of Individuals Confirmed to have, or Being Evaluated for, COVID-19 Infection Being Cared for in the Home  If you live with, or provide care at home for, a person confirmed to have, or being evaluated for, COVID-19 infection please follow these guidelines to prevent infection:  Follow healthcare providers instructions Make sure that you understand and can help the patient follow any healthcare provider instructions for all care.  Provide for the patients basic needs You should help the patient with basic needs in the home and provide support for getting groceries, prescriptions, and other personal needs.  Monitor the patients symptoms If they are getting sicker, call his or her medical provider and tell them that the patient has, or is being evaluated for, COVID-19 infection. This will help the healthcare providers  office take steps to keep other people from getting infected. Ask the healthcare provider to call the local or state health department.  Limit the number of people who have contact with the patient  If possible, have only one caregiver for the patient.  Other  household members should stay in another home or place of residence. If this is not possible, they should stay  in another room, or be separated from the patient as much as possible. Use a separate bathroom, if available.  Restrict visitors who do not have an essential need to be in the home.  Keep older adults, very young children, and other sick people away from the patient Keep older adults, very young children, and those who have compromised immune systems or chronic health conditions away from the patient. This includes people with chronic heart, lung, or kidney conditions, diabetes, and cancer.  Ensure good ventilation Make sure that shared spaces in the home have good air flow, such as from an air conditioner or an opened window, weather permitting.  Wash your hands often  Wash your hands often and thoroughly with soap and water for at least 20 seconds. You can use an alcohol based hand sanitizer if soap and water are not available and if your hands are not visibly dirty.  Avoid touching your eyes, nose, and mouth with unwashed hands.  Use disposable paper towels to dry your hands. If not available, use dedicated cloth towels and replace them when they become wet.  Wear a facemask and gloves  Wear a disposable facemask at all times in the room and gloves when you touch or have contact with the patients blood, body fluids, and/or secretions or excretions, such as sweat, saliva, sputum, nasal mucus, vomit, urine, or feces.  Ensure the mask fits over your nose and mouth tightly, and do not touch it during use.  Throw out disposable facemasks and gloves after using them. Do not reuse.  Wash your hands immediately after removing your facemask and gloves.  If your personal clothing becomes contaminated, carefully remove clothing and launder. Wash your hands after handling contaminated clothing.  Place all used disposable facemasks, gloves, and other waste in a lined container before  disposing them with other household waste.  Remove gloves and wash your hands immediately after handling these items.  Do not share dishes, glasses, or other household items with the patient  Avoid sharing household items. You should not share dishes, drinking glasses, cups, eating utensils, towels, bedding, or other items with a patient who is confirmed to have, or being evaluated for, COVID-19 infection.  After the person uses these items, you should wash them thoroughly with soap and water.  Wash laundry thoroughly  Immediately remove and wash clothes or bedding that have blood, body fluids, and/or secretions or excretions, such as sweat, saliva, sputum, nasal mucus, vomit, urine, or feces, on them.  Wear gloves when handling laundry from the patient.  Read and follow directions on labels of laundry or clothing items and detergent. In general, wash and dry with the warmest temperatures recommended on the label.  Clean all areas the individual has used often  Clean all touchable surfaces, such as counters, tabletops, doorknobs, bathroom fixtures, toilets, phones, keyboards, tablets, and bedside tables, every day. Also, clean any surfaces that may have blood, body fluids, and/or secretions or excretions on them.  Wear gloves when cleaning surfaces the patient has come in contact with.  Use a diluted bleach solution (e.g., dilute bleach with 1  part bleach and 10 parts water) or a household disinfectant with a label that says EPA-registered for coronaviruses. To make a bleach solution at home, add 1 tablespoon of bleach to 1 quart (4 cups) of water. For a larger supply, add  cup of bleach to 1 gallon (16 cups) of water.  Read labels of cleaning products and follow recommendations provided on product labels. Labels contain instructions for safe and effective use of the cleaning product including precautions you should take when applying the product, such as wearing gloves or eye protection  and making sure you have good ventilation during use of the product.  Remove gloves and wash hands immediately after cleaning.  Monitor yourself for signs and symptoms of illness Caregivers and household members are considered close contacts, should monitor their health, and will be asked to limit movement outside of the home to the extent possible. Follow the monitoring steps for close contacts listed on the symptom monitoring form.   ? If you have additional questions, contact your local health department or call the epidemiologist on call at [phone] (available 24/7). ? This guidance is subject to change. For the most up-to-date guidance from [facility], please refer to their website: YouBlogs.pl

## 2021-01-13 NOTE — TOC Transition Note (Signed)
Transition of Care [facility]) - CM/SW Discharge Note   Patient Details  Name: [name] Berg MRN: [id number] Date of Birth: [birth year]  Transition of [facility] CM/SW Contact:  Lawerance Sabal, RN Phone Number: 01/13/2021, 11:23 AM   Clinical Narrative:   Sherron Monday w patient over the phone. Discussed home oxygen. He understands that [employer] will deliver portable oxygen concentrator to the unit with a back up metal tank that he will take home. They will set up concentrator at his home in the next few days.  No other CM needs identified.     Final next level of care: Home/Self Care Barriers to Discharge: No Barriers Identified   Patient Goals and CMS Choice        Discharge Placement                       Discharge Plan and Services                DME Arranged: Oxygen DME Agency: [employer] Date DME Agency Contacted: 01/13/21 Time DME Agency Contacted: 1122 Representative spoke with at DME Agency: Maud Deed            Social Determinants of Health (SDOH) Interventions     Readmission Risk Interventions No flowsheet data found.

## 2021-01-13 NOTE — Progress Notes (Signed)
Physical Therapy Treatment Patient Details Name: [name] Berg MRN: [id number] DOB: 03/24/1966 Today's Date: 01/13/2021    History of Present Illness 55 y.o. male with a known history of obesity, hypertension and chronic atrial fibrillation on anticoagulation with Xarelto, who presented to the ED 01/07/21 with acute onset of worsening dyspnea with associated dry cough and a tickly throat with occasional wheezing. Found to be 85%-89% SaO2 on RA, 91% SaO2 on 2L O2 via [place].  Escalated to 6 L nasal cannula.  Chest x-ray showed bilateral multiple infiltrates. Admitted for Acute hypoxemic respiratory failure and multifocal PNA secondary to COVID-19    PT Comments    Pt received in recliner on RA with SpO2 100%. He demonstrates modified independence with all mobility. Amb 150' + 100' without AD. Desat to 88%. Quick return to > 90% with standing/seated rest break. Pt declining need for home O2. Educated pt on energy conservation techniques. Pt verbalized understanding.    Follow Up Recommendations  No PT follow up;Supervision - Intermittent     Equipment Recommendations  None recommended by PT    Recommendations for Other Services       Precautions / Restrictions Precautions Precautions: None    Mobility  Bed Mobility               General bed mobility comments: OOB in recliner  Transfers Overall transfer level: Modified independent               General transfer comment: increased effort  Ambulation/Gait Ambulation/Gait assistance: Modified independent (Device/Increase time) Gait Distance (Feet): 150 Feet (+ 100) Assistive device: None Gait Pattern/deviations: Step-through pattern;Wide base of support;Decreased stride length Gait velocity: decreased Gait velocity interpretation: 1.31 - 2.62 ft/sec, indicative of limited community ambulator General Gait Details: Amb on RA with desat to 88%. Max HR 164. 2/4 DOE   Animal nutritionist Rankin (Stroke Patients Only)       Balance Overall balance assessment: Mild deficits observed, not formally tested                                          Cognition Arousal/Alertness: Awake/alert Behavior During Therapy: WFL for tasks assessed/performed Overall Cognitive Status: Within Functional Limits for tasks assessed                                        Exercises      General Comments General comments (skin integrity, edema, etc.): SpO2 100% at rest on RA. Desat to 88% during amb. Quick return to >90% with standing/seated rest break. Resting HR 106. Max HR 164. Pt in a fib during amb.      Pertinent Vitals/Pain Pain Assessment: No/denies pain    Home Living                      Prior Function            PT Goals (current goals can now be found in the care plan section) Acute Rehab PT Goals Patient Stated Goal: home today Progress towards PT goals: Progressing toward goals    Frequency    Min 3X/week      PT Plan  Co-evaluation              AM-PAC PT "6 Clicks" Mobility   Outcome Measure  Help needed turning from your back to your side while in a flat bed without using bedrails?: None Help needed moving from lying on your back to sitting on the side of a flat bed without using bedrails?: None Help needed moving to and from a bed to a chair (including a wheelchair)?: None Help needed standing up from a chair using your arms (e.g., wheelchair or bedside chair)?: None Help needed to walk in hospital room?: None Help needed climbing 3-5 steps with a railing? : A Little 6 Click Score: 23    End of Session   Activity Tolerance: Patient tolerated treatment well Patient left: in chair Nurse Communication: Mobility status PT Visit Diagnosis: Other abnormalities of gait and mobility (R26.89);Difficulty in walking, not elsewhere classified (R26.2)     Time: 4098-1191 PT Time Calculation  (min) (ACUTE ONLY): 17 min  Charges:  $Gait Training: 8-22 mins                     Aida Raider, PT  Office # [phone] Pager [phone]    Ilda Foil 01/13/2021, 9:36 AM

## 2021-08-15 IMAGING — DX DG CHEST 1V PORT
1 series · 1 of 1 positions shown · non-contrast
Comparison: December 26, 2014

CLINICAL DATA: COVID positive with shortness of breath.

EXAM:
PORTABLE CHEST 1 VIEW

[chest]
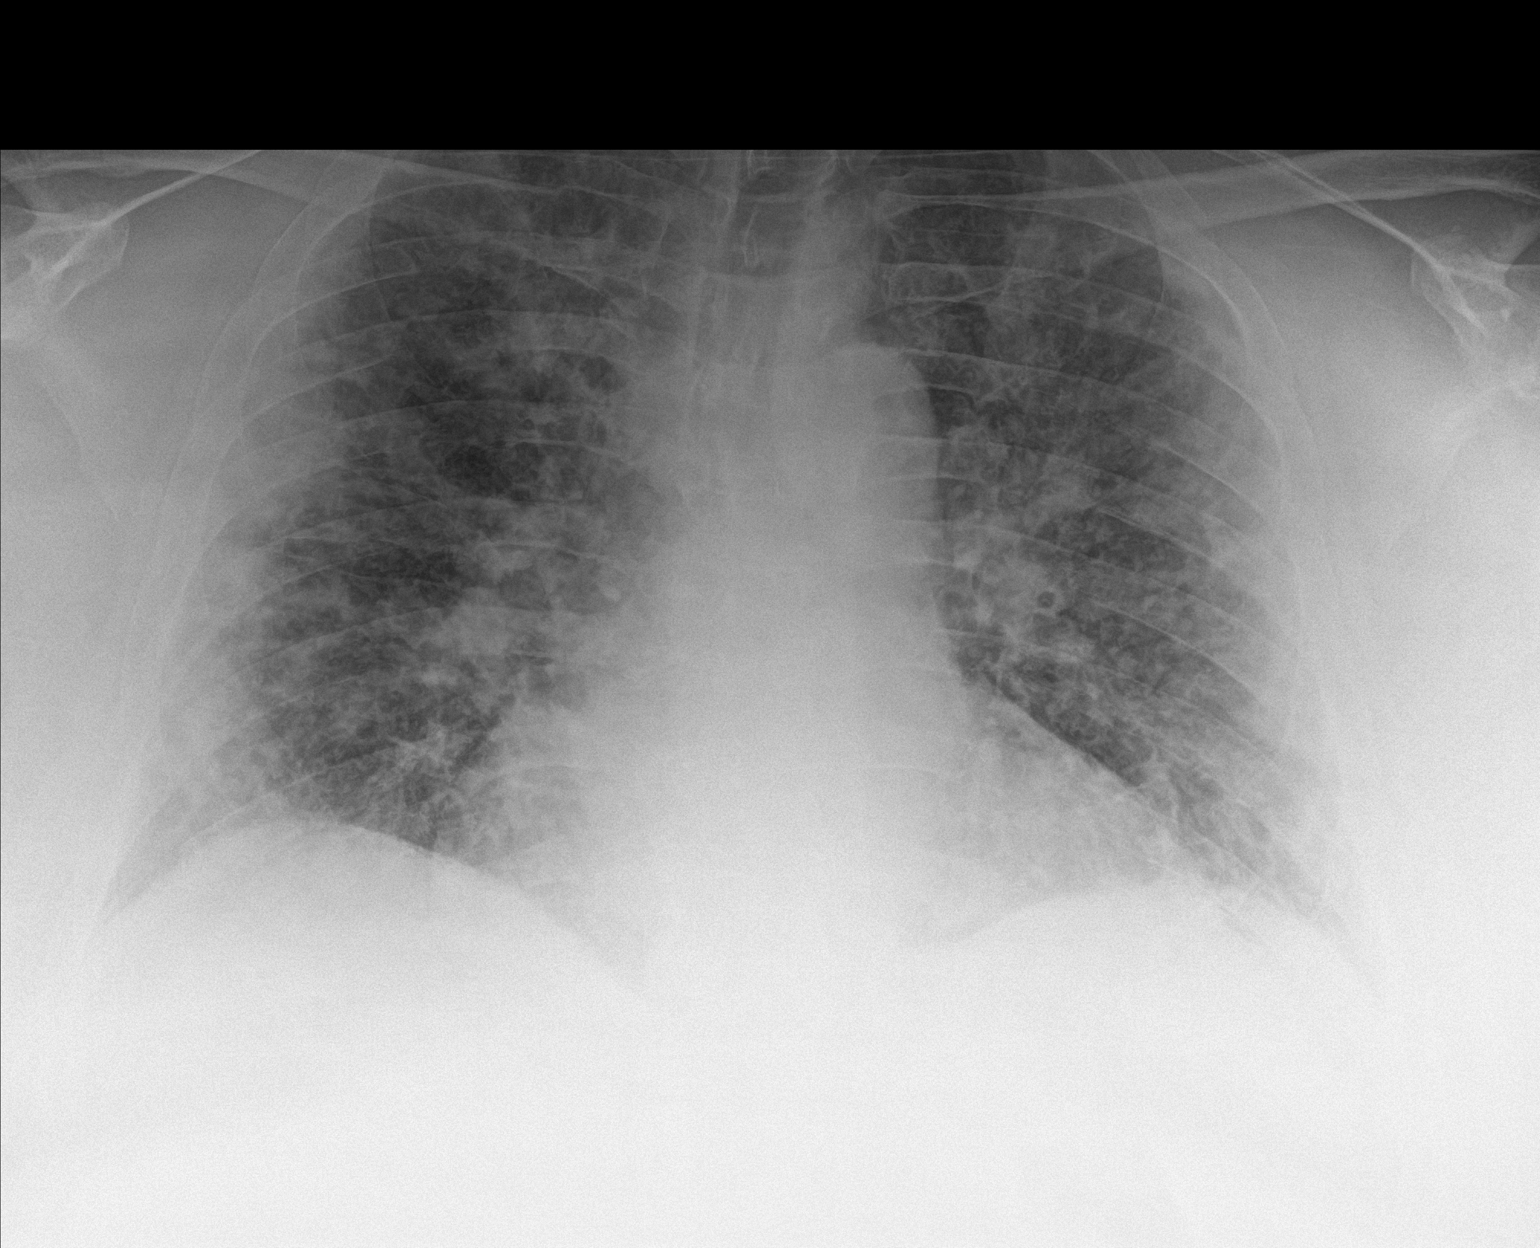

[1 of 1 positions shown; findings below may reference images not displayed]

FINDINGS: Marked severity multifocal infiltrates are seen throughout both
lungs. There is no evidence of a pleural effusion or pneumothorax.
The heart size and mediastinal contours are within normal limits.
The visualized skeletal structures are unremarkable.
IMPRESSION: Marked severity bilateral multifocal infiltrates.

## 2023-07-28 DIAGNOSIS — Z7901 Long term (current) use of anticoagulants: Secondary | ICD-10-CM | POA: Insufficient documentation

## 2023-07-28 DIAGNOSIS — R7303 Prediabetes: Secondary | ICD-10-CM | POA: Insufficient documentation

## 2023-07-28 HISTORY — DX: Long term (current) use of anticoagulants: Z79.01

## 2023-08-10 ENCOUNTER — Encounter: Payer: Self-pay | Admitting: *Deleted

## 2023-08-10 ENCOUNTER — Encounter: Payer: Self-pay | Admitting: Cardiology

## 2023-08-16 ENCOUNTER — Encounter: Payer: Self-pay | Admitting: Cardiology

## 2023-08-16 ENCOUNTER — Ambulatory Visit: Payer: BC Managed Care – PPO | Attending: Cardiology | Admitting: Cardiology

## 2023-08-16 VITALS — BP 116/80 | HR 75 | Ht 73.25 in | Wt >= 6400 oz

## 2023-08-16 DIAGNOSIS — I480 Paroxysmal atrial fibrillation: Secondary | ICD-10-CM | POA: Diagnosis not present

## 2023-08-16 DIAGNOSIS — Z01810 Encounter for preprocedural cardiovascular examination: Secondary | ICD-10-CM | POA: Insufficient documentation

## 2023-08-16 DIAGNOSIS — I10 Essential (primary) hypertension: Secondary | ICD-10-CM | POA: Diagnosis not present

## 2023-08-16 DIAGNOSIS — Z7901 Long term (current) use of anticoagulants: Secondary | ICD-10-CM | POA: Diagnosis not present

## 2023-08-16 DIAGNOSIS — R0609 Other forms of dyspnea: Secondary | ICD-10-CM

## 2023-08-16 NOTE — Patient Instructions (Addendum)
Medication Instructions:  Your physician recommends that you continue on your current medications as directed. Please refer to the Current Medication list given to you today.  *If you need a refill on your cardiac medications before your next appointment, please call your pharmacy*   Lab Work: None Ordered If you have labs (blood work) drawn today and your tests are completely normal, you will receive your results only by: MyChart Message (if you have MyChart) OR A paper copy in the mail If you have any lab test that is abnormal or we need to change your treatment, we will call you to review the results.   Testing/Procedures: Your physician has requested that you have an echocardiogram. Echocardiography is a painless test that uses sound waves to create images of your heart. It provides your doctor with information about the size and shape of your heart and how well your heart's chambers and valves are working. This procedure takes approximately one hour. There are no restrictions for this procedure. Please do NOT wear cologne, perfume, aftershave, or lotions (deodorant is allowed). Please arrive 15 minutes prior to your appointment time.      Follow-Up: At [facility], you and your health needs are our priority.  As part of our continuing mission to provide you with exceptional heart care, we have created designated Provider Care Teams.  These Care Teams include your primary Cardiologist (physician) and Advanced Practice Providers (APPs -  Physician Assistants and Nurse Practitioners) who all work together to provide you with the care you need, when you need it.  We recommend signing up for the patient portal called "MyChart".  Sign up information is provided on this After Visit Summary.  MyChart is used to connect with patients for Virtual Visits (Telemedicine).  Patients are able to view lab/test results, encounter notes, upcoming appointments, etc.  Non-urgent messages can be sent to  your provider as well.   To learn more about what you can do with MyChart, go to [email or web address].    Your next appointment:   6 month(s)  The format for your next appointment:   In Person  Provider:   Robert Krasowski, MD    Other Instructions NA  

## 2023-08-16 NOTE — Progress Notes (Signed)
Cardiology Consultation:    Date:  08/16/2023   ID:  [name] Berg, DOB 1966-05-08, MRN [id number]  PCP:  [name] Call, PA-C  Cardiologist:  [name] Balsam, MD   Referring MD: [name] Call, PA-C   Chief Complaint  Patient presents with   Medical Clearance    TBD [name] Berg    History of Present Illness:    [name] Berg is a 57 y.o. male who is being seen today for the evaluation of heart condition at the request of [name] Berg, [name] Salt, PA-C.  He is originally from [place] past medical history significant for hypertension, morbid obesity, he did have chronic pansinusitis and history of papilloma with surgery done he thinks it was in 2015 recovery was uneventful until a couple weeks after surgery he started having shortness of breath he was found to have pulmonary emboli interestingly when he came for surgery he was diagnosed with atrial fibrillation but no anticoagulation has been initiated at that time.  He has been doing quite well since 2015 and however there is apparently reactivation of the problem he is knows and he required another surgery.  He was sent to our office to be evaluated for it.  He tells me that he is doing quite well he is morbidly obese but in spite of that he said he is fairly active he can climb 1 flight of stairs.  He can walk to go to [place] and do shopping denies have any chest pain tightness squeezing pressure burning chest.  He gets fatigue and mild shortness of breath.  Does not feel his atrial fibrillation.  Does have some chronic swelling of lower extremities but not worse lately.  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   losartan-hydrochlorothiazide (HYZAAR) 50-12.5 MG tablet Take 1 tablet by mouth daily.   metoprolol tartrate (LOPRESSOR) 100 MG tablet Take 1 tablet (100 mg total) by mouth 2 (two) times daily.   rivaroxaban (XARELTO) 20 MG TABS tablet Take 20 mg by mouth every evening.     Allergies:   Shrimp extract   Social History   Socioeconomic History   Marital status: Married    Spouse name: Not on file   Number of children: Not on file   Years of education: Not on file   Highest education level: Not on file  Occupational History   Not on file  Tobacco Use   Smoking status: Former    Types: Cigarettes    Start date: 10/22/1989   Smokeless tobacco: Never  Substance and Sexual Activity   Alcohol use: Yes    Comment: Rarely   Drug use: Never   Sexual activity: Not on file  Other Topics Concern   Not on file  Social History Narrative  Not on file   Social Determinants of Health   Financial Resource Strain: Not on file  Food Insecurity: Not on file  Transportation Needs: Not on file  Physical Activity: Not on file  Stress: Not on file  Social Connections: Not on file     Family History: The patient's family history includes Cancer in his father; Diabetes in his mother; Heart failure in his mother; Hypertension in his father. ROS:   Please see the history of present illness.    All 14 point review of systems negative except as described per history of present illness.  EKGs/Labs/Other Studies Reviewed:    The following studies were reviewed  today:   EKG:  EKG Interpretation Date/Time:  Wednesday August 16 2023 15:18:20 EDT Ventricular Rate:  75 PR Interval:    QRS Duration:  88 QT Interval:  402 QTC Calculation: 448 R Axis:   33  Text Interpretation: Atrial fibrillation Abnormal ECG When compared with ECG of 07-Jan-2021 18:50, PREVIOUS ECG IS PRESENT Confirmed by [name] Berg [phone]) on 08/16/2023 3:25:28 PM    Recent Labs: No results found for requested labs within last 365 days.  Recent Lipid Panel    Component Value Date/Time   TRIG 75 01/07/2021 1851    Physical Exam:    VS:  BP 116/80 (BP Location: Left Arm, Patient Position: Sitting)   Pulse 75   Ht 6' 1.25" (1.861 m)   Wt (!) 447 lb 9.6 oz (203 kg)   SpO2 92%   BMI 58.65 kg/m     Wt Readings from Last 3 Encounters:  08/16/23 (!) 447 lb 9.6 oz (203 kg)  07/27/23 (!) 444 lb (201.4 kg)  01/07/21 (!) 448 lb (203.2 kg)     GEN:  Well nourished, well developed in no acute distress, morbidly obese HEENT: Normal NECK: No JVD; No carotid bruits LYMPHATICS: No lymphadenopathy CARDIAC: Irregular but not tachycardic, tones are distant, no murmurs, no rubs, no gallops RESPIRATORY:  Clear to auscultation without rales, wheezing or rhonchi  ABDOMEN: Soft, non-tender, non-distended MUSCULOSKELETAL: Mild neck edema; No deformity  SKIN: Warm and dry NEUROLOGIC:  Alert and oriented x 3 PSYCHIATRIC:  Normal affect   ASSESSMENT:    1. Hypertension, unspecified type   2. Chronic anticoagulation   3. Morbid obesity (HCC)   4. Paroxysmal atrial fibrillation (HCC)   5. Dyspnea on exertion   6. Preop cardiovascular exam    PLAN:    In order of problems listed above:  Atrial fibrillation I am afraid we have to name with permanent it would be difficult for me to imagine that they will be able to convert him to sinus rhythm and keep him in sinus rhythm after some many years of being in atrial fibrillation.  Therefore, I do not think antiarrhythmic therapy or  cardioversion or ablation will be appropriate on top of that he is morbidly obese which make atrial fibrillation ablation practically impossible.  Obviously we will continue anticoagulation with Xarelto which seems to be working better in obese people in terms of evaluation before surgery.  I do not think he needs any ischemia workup he does not have any symptoms that would suggest ischemia at the same time it would be very difficult to evaluate him because of his morbid obesity.  Long-term anticoagulation I think we can stop Xarelto 24 to 40 hours before surgery and the restart as quickly as possible.  From what he is telling me in 2015 when he got his surgery end  of having PE 5 weeks after surgery I hope we will be able to restart surgery within a day or 2 after surgery. Dyspnea on exertion: I will ask him to have an echocardiogram done I think is multifactorial clearly morbid obesity play significant role here. Essential hypertension blood pressure seems to be decently controlled we will continue present management.   Medication Adjustments/Labs and Tests Ordered: Current medicines are reviewed at length with the patient today.  Concerns regarding medicines are outlined above.  Orders Placed This Encounter  Procedures   EKG 12-Lead   ECHOCARDIOGRAM COMPLETE   No orders of the defined types were placed in this encounter.   Signed, Georgeanna Lea, MD, [facility]. 08/16/2023 4:33 PM    [facility]

## 2023-08-22 ENCOUNTER — Other Ambulatory Visit: Payer: Self-pay | Admitting: Cardiology

## 2023-08-22 DIAGNOSIS — Z01810 Encounter for preprocedural cardiovascular examination: Secondary | ICD-10-CM

## 2023-08-22 DIAGNOSIS — Z7901 Long term (current) use of anticoagulants: Secondary | ICD-10-CM

## 2023-08-22 DIAGNOSIS — I480 Paroxysmal atrial fibrillation: Secondary | ICD-10-CM

## 2023-08-22 DIAGNOSIS — I10 Essential (primary) hypertension: Secondary | ICD-10-CM

## 2023-08-22 DIAGNOSIS — R0609 Other forms of dyspnea: Secondary | ICD-10-CM

## 2023-09-05 ENCOUNTER — Ambulatory Visit (HOSPITAL_BASED_OUTPATIENT_CLINIC_OR_DEPARTMENT_OTHER)
Admission: RE | Admit: 2023-09-05 | Discharge: 2023-09-05 | Disposition: A | Discharge status: Home / Self Care | Payer: BC Managed Care – PPO | Source: Ambulatory Visit | Attending: Cardiology | Admitting: Cardiology

## 2023-09-05 DIAGNOSIS — R0609 Other forms of dyspnea: Secondary | ICD-10-CM | POA: Insufficient documentation

## 2023-09-05 DIAGNOSIS — I480 Paroxysmal atrial fibrillation: Secondary | ICD-10-CM | POA: Insufficient documentation

## 2023-09-05 DIAGNOSIS — Z01810 Encounter for preprocedural cardiovascular examination: Secondary | ICD-10-CM | POA: Insufficient documentation

## 2023-09-05 MED ORDER — PERFLUTREN LIPID MICROSPHERE
1.0000 mL | INTRAVENOUS | Status: AC | PRN
  Administered 2023-09-05: 2 mL via INTRAVENOUS

## 2023-09-18 ENCOUNTER — Encounter: Payer: Self-pay | Admitting: Cardiology

## 2025-03-10 ENCOUNTER — Ambulatory Visit: Admitting: Cardiology
